# Patient Record
Sex: Female | Born: 1960 | Race: Black or African American | Hispanic: No | Marital: Married | Smoking: Never smoker
Health system: Southern US, Community
[De-identification: ages and names within clinical notes are randomized; demographics above are authoritative.]

## PROBLEM LIST (undated history)

## (undated) DIAGNOSIS — R011 Cardiac murmur, unspecified: Secondary | ICD-10-CM

## (undated) DIAGNOSIS — I10 Essential (primary) hypertension: Secondary | ICD-10-CM

## (undated) DIAGNOSIS — E785 Hyperlipidemia, unspecified: Secondary | ICD-10-CM

## (undated) HISTORY — PX: TRIGGER FINGER RELEASE: SHX641

## (undated) HISTORY — DX: Essential (primary) hypertension: I10

## (undated) HISTORY — DX: Hyperlipidemia, unspecified: E78.5

## (undated) HISTORY — DX: Cardiac murmur, unspecified: R01.1

## (undated) HISTORY — PX: TONSILLECTOMY: SUR1361

## (undated) HISTORY — PX: UPPER GI ENDOSCOPY: SHX6162

## (undated) HISTORY — PX: COLONOSCOPY: SHX174

---

## 1998-01-20 ENCOUNTER — Other Ambulatory Visit: Admission: RE | Admit: 1998-01-20 | Discharge: 1998-01-20 | Payer: Self-pay | Admitting: Family Medicine

## 1999-02-23 ENCOUNTER — Other Ambulatory Visit: Admission: RE | Admit: 1999-02-23 | Discharge: 1999-02-23 | Payer: Self-pay | Admitting: Family Medicine

## 2001-03-27 ENCOUNTER — Other Ambulatory Visit: Admission: RE | Admit: 2001-03-27 | Discharge: 2001-03-27 | Payer: Self-pay | Admitting: Family Medicine

## 2002-04-02 ENCOUNTER — Other Ambulatory Visit: Admission: RE | Admit: 2002-04-02 | Discharge: 2002-04-02 | Payer: Self-pay | Admitting: Family Medicine

## 2003-04-04 ENCOUNTER — Other Ambulatory Visit: Admission: RE | Admit: 2003-04-04 | Discharge: 2003-04-04 | Payer: Self-pay | Admitting: Family Medicine

## 2004-08-24 ENCOUNTER — Encounter: Admission: RE | Admit: 2004-08-24 | Discharge: 2004-08-24 | Payer: Self-pay | Admitting: Family Medicine

## 2004-10-14 ENCOUNTER — Other Ambulatory Visit: Admission: RE | Admit: 2004-10-14 | Discharge: 2004-10-14 | Payer: Self-pay | Admitting: Family Medicine

## 2005-08-27 ENCOUNTER — Encounter: Admission: RE | Admit: 2005-08-27 | Discharge: 2005-08-27 | Payer: Self-pay | Admitting: Family Medicine

## 2005-09-09 ENCOUNTER — Encounter: Admission: RE | Admit: 2005-09-09 | Discharge: 2005-09-09 | Payer: Self-pay | Admitting: Family Medicine

## 2005-09-09 IMAGING — CR DG ANKLE COMPLETE 3+V*R*
3 series · 3 of 3 positions shown · non-contrast
Comparison: None.

CLINICAL DATA: Right ankle pain.
 THREE VIEW RIGHT ANKLE:

[view not recorded (1 of 3)]
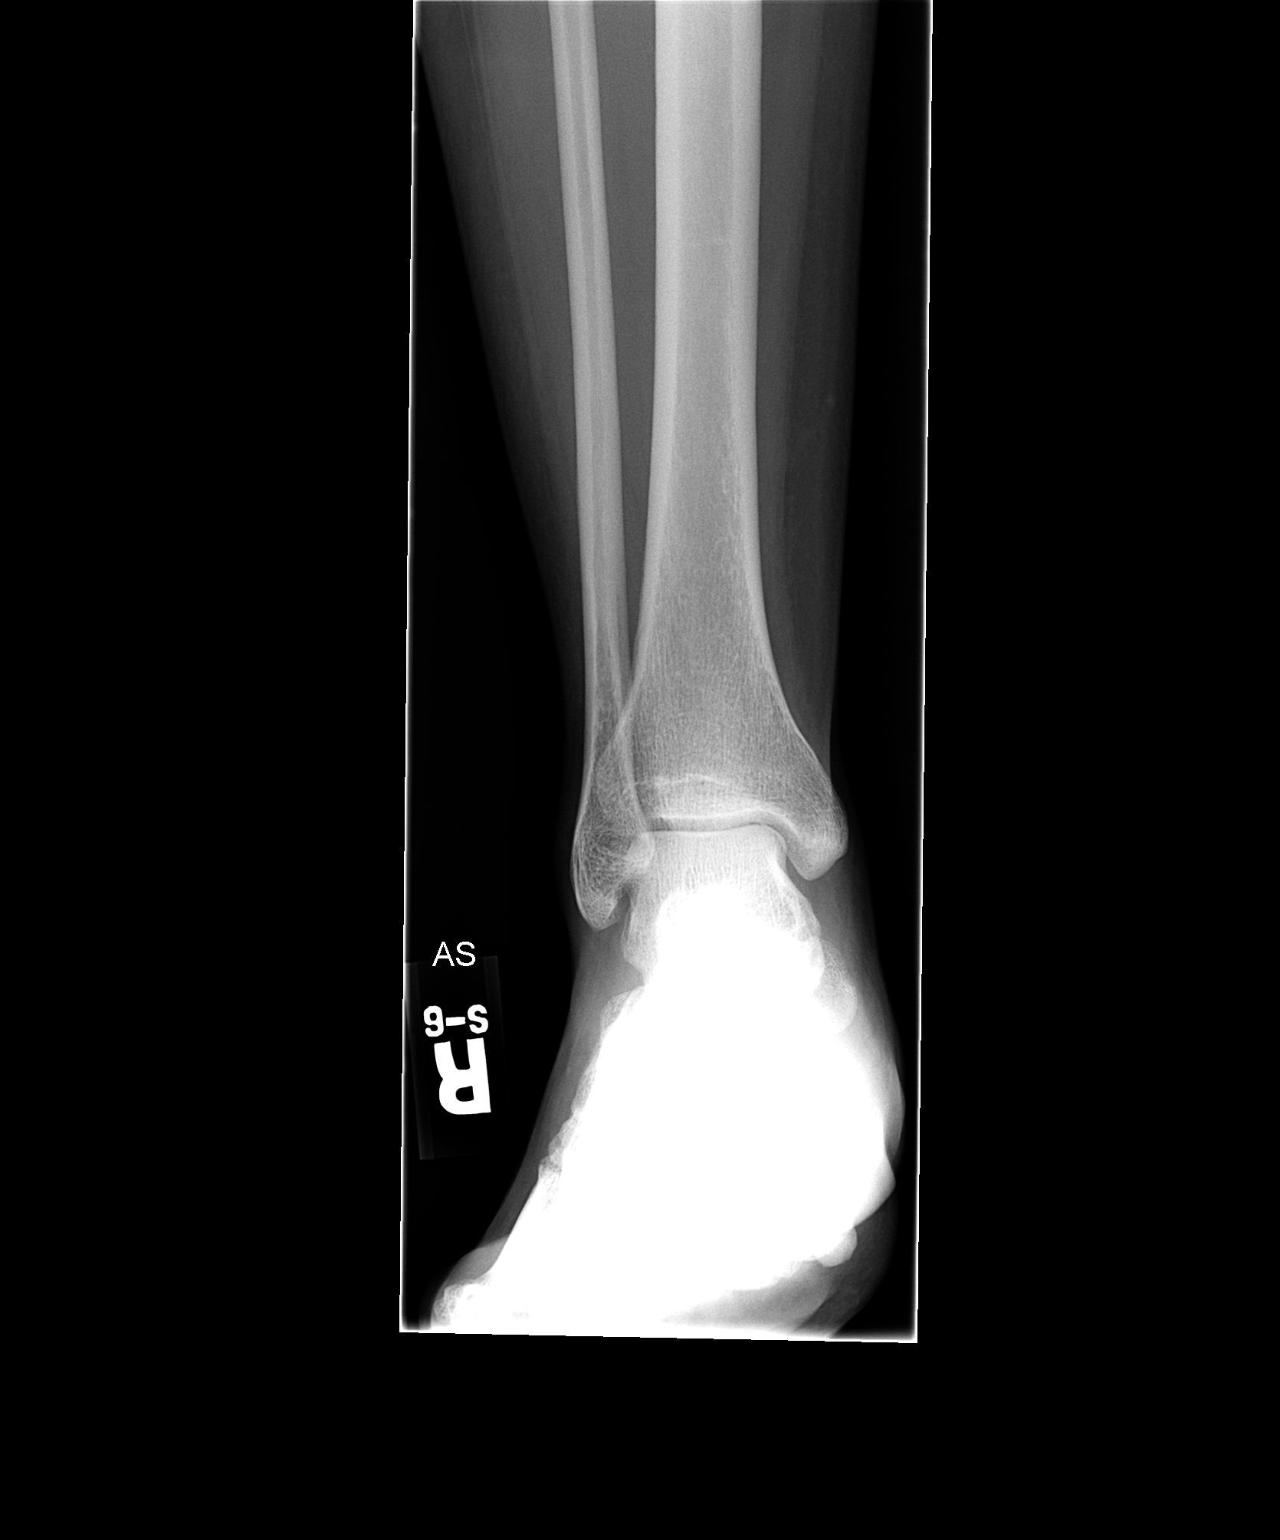

[view not recorded (2 of 3)]
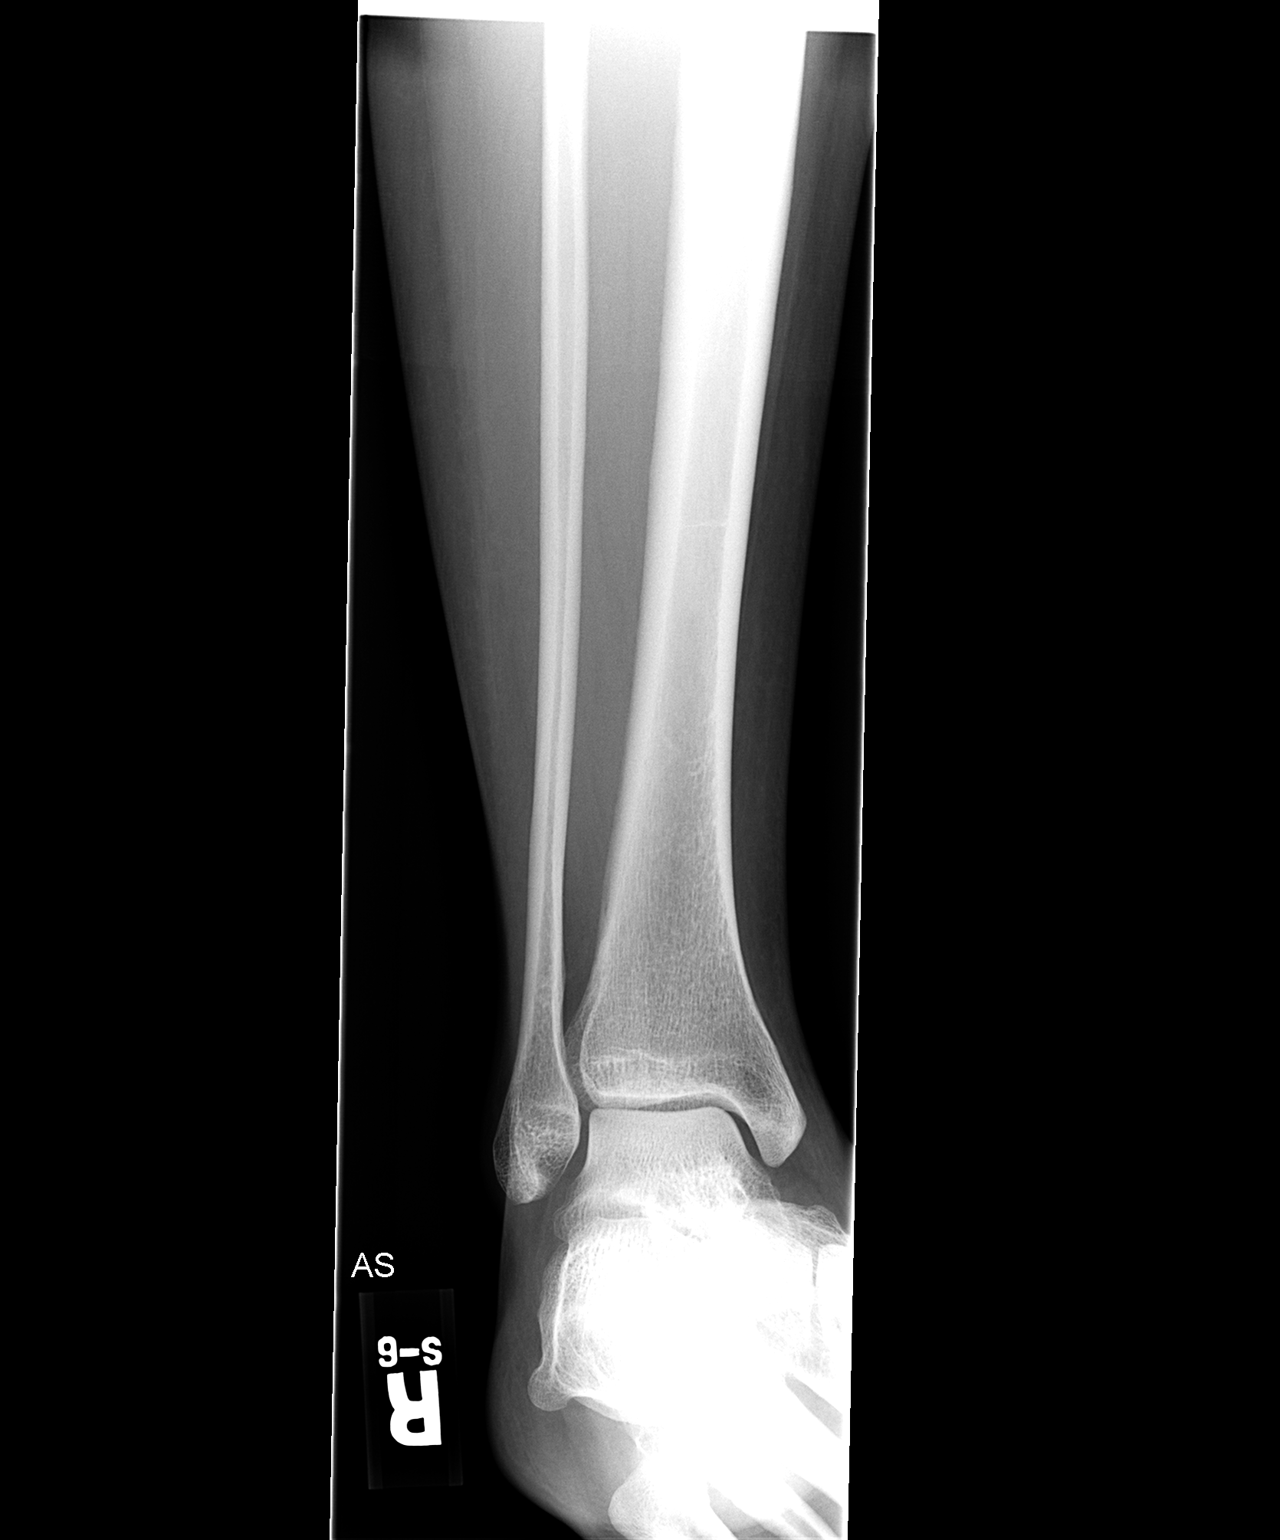

[view not recorded (3 of 3)]
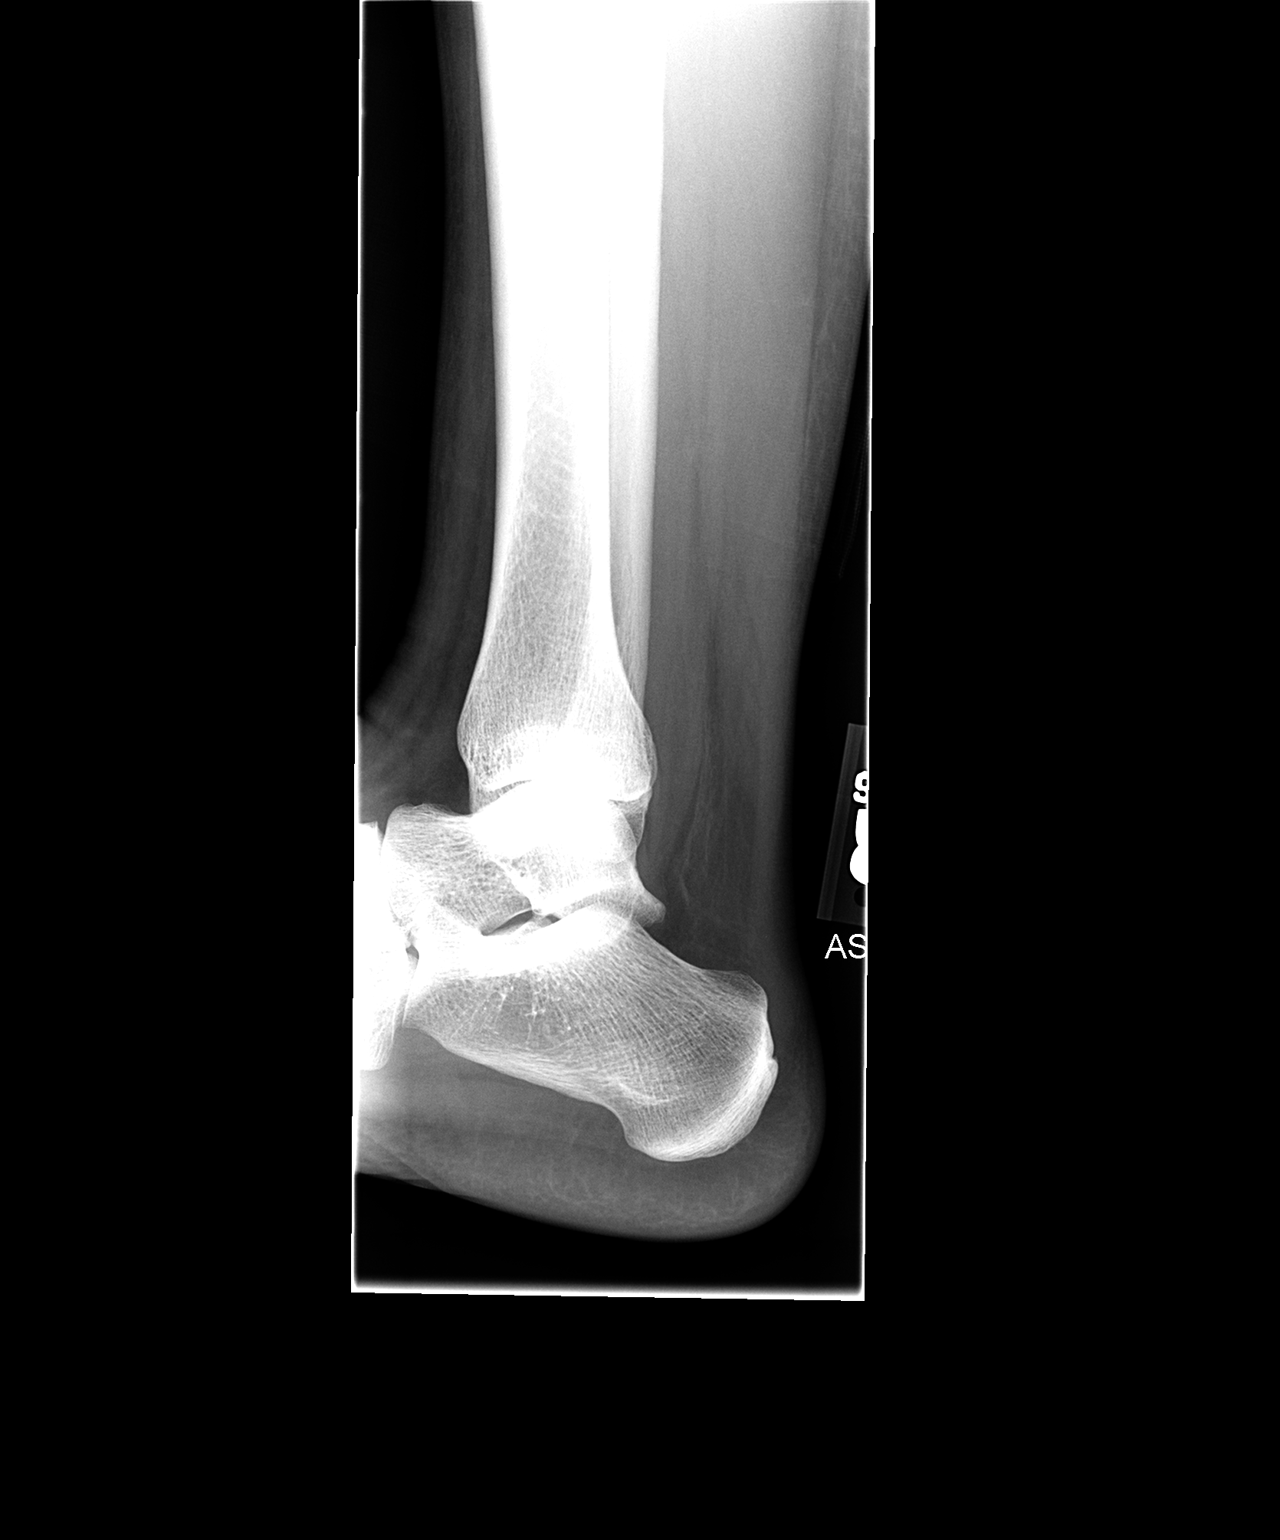

[3 of 3 positions shown; findings below may reference images not displayed]

There is no evidence of fracture, dislocation, or joint effusion. There is no evidence of arthropathy or other focal bone abnormality.  Soft tissues are unremarkable.
IMPRESSION: Negative.

## 2006-04-29 ENCOUNTER — Encounter: Admission: RE | Admit: 2006-04-29 | Discharge: 2006-04-29 | Payer: Self-pay | Admitting: Sports Medicine

## 2006-04-29 IMAGING — CT CT EXTREM UP W/O CM*R*
1 of 3 series · 9 of 14 positions shown, 12 images · IV contrast (agent unspecified)
Comparison: none

CLINICAL DATA: Proximal 2nd metacarpal fracture.  Evaluate healing. 
 CT OF THE RIGHT HAND WITHOUT CONTRAST:
TECHNIQUE: Multidetector CT imaging through the right hand was performed according to the standard protocol.  No intravenous contrast was administered.  Multiplanar CT image reconstructions were also generated.

[Series 4: wrist/standard · axial · 0.23mm/px · z∈[+21,+153]mm · 9 of 265 slices shown, 12 images]
[im 27/265  soft-tissue]
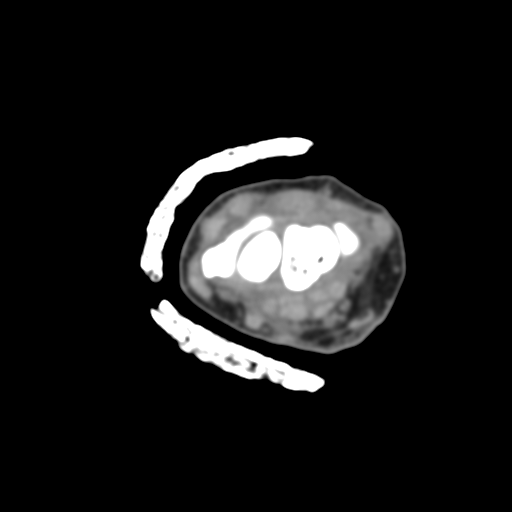
[im 27/265  bone]
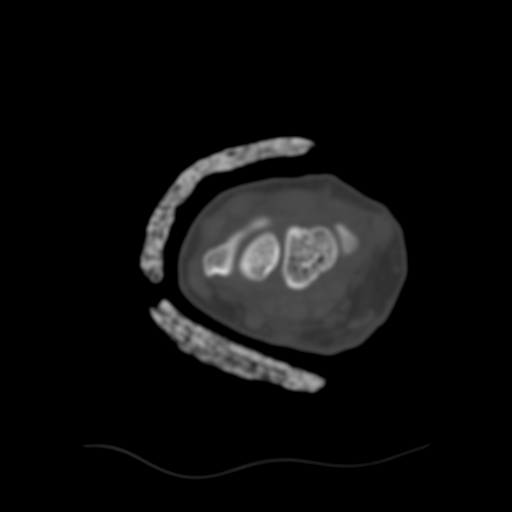
[im 53/265  bone]
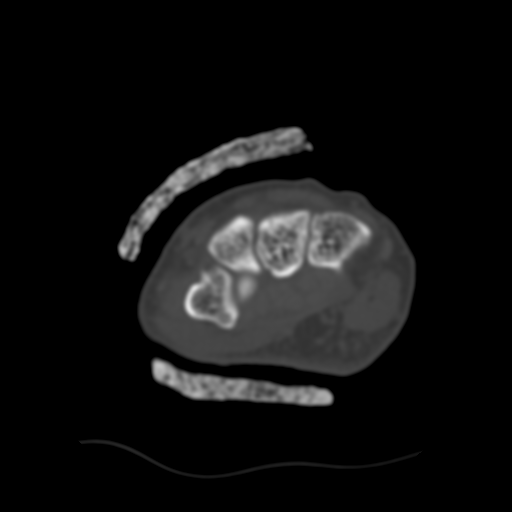
[im 80/265  bone]
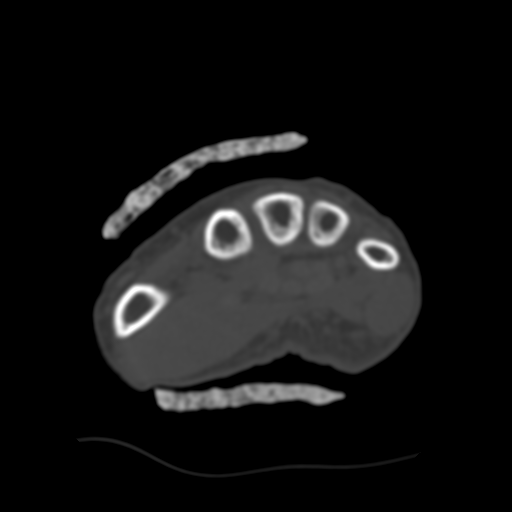
[im 106/265  bone]
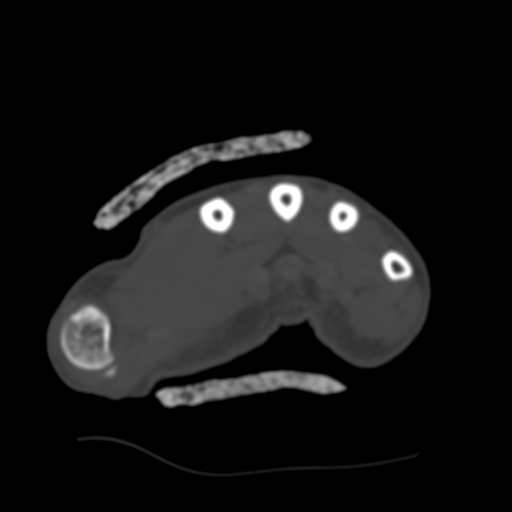
[im 133/265  soft-tissue]
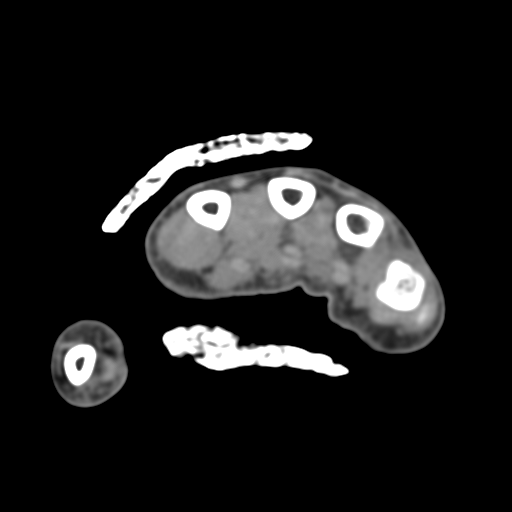
[im 133/265  bone]
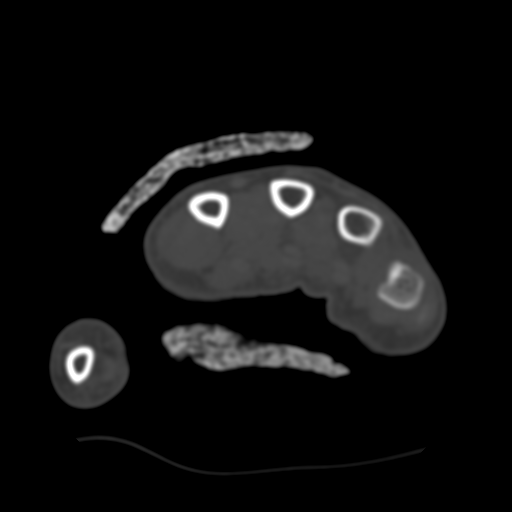
[im 159/265  bone]
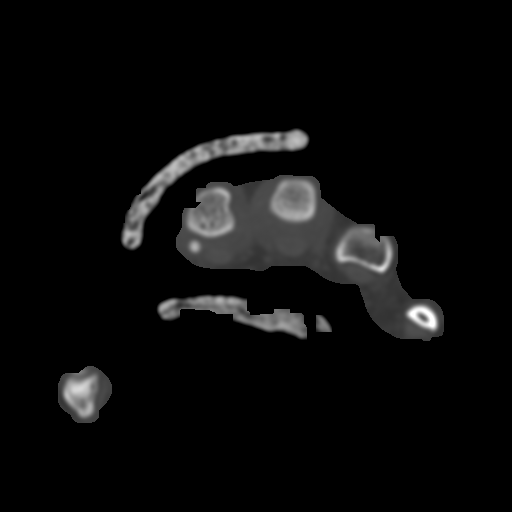
[im 185/265  bone]
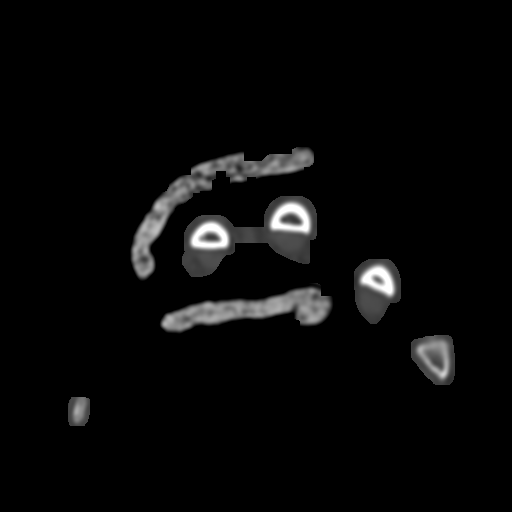
[im 212/265  bone]
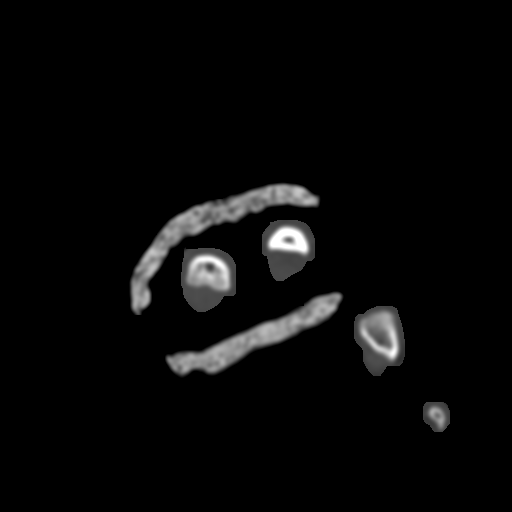
[im 238/265  soft-tissue]
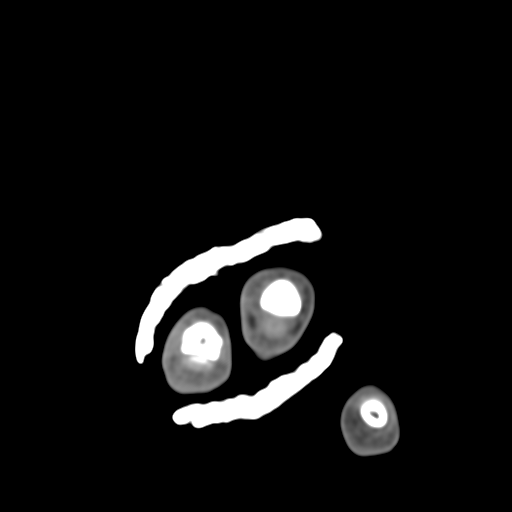
[im 238/265  bone]
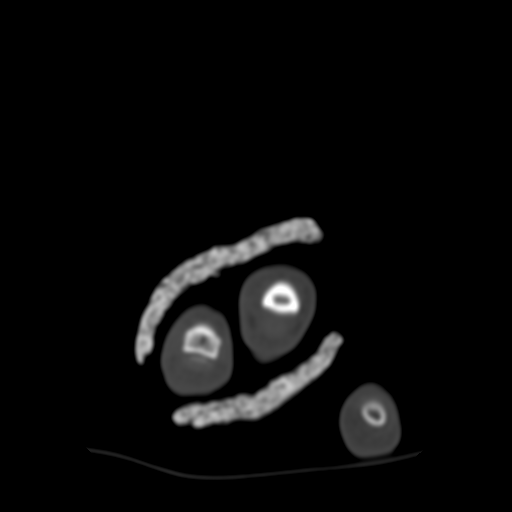

[9 of 14 positions shown; findings below may reference images not displayed]

FINDINGS: The 2nd and 3rd digits are in cast.  No fracture is seen.  There is no evidence of callus formation.  Alignment is normal.  The carpal bones are in normal position and the radiocarpal joint space appears normal.
IMPRESSION: No fracture is seen.  No callus is noted.  Normal alignment is maintained.

## 2006-08-30 ENCOUNTER — Encounter: Admission: RE | Admit: 2006-08-30 | Discharge: 2006-08-30 | Payer: Self-pay | Admitting: Family Medicine

## 2007-09-01 ENCOUNTER — Encounter: Admission: RE | Admit: 2007-09-01 | Discharge: 2007-09-01 | Payer: Self-pay | Admitting: Family Medicine

## 2008-09-05 ENCOUNTER — Encounter: Admission: RE | Admit: 2008-09-05 | Discharge: 2008-09-05 | Payer: Self-pay

## 2009-09-26 ENCOUNTER — Encounter: Admission: RE | Admit: 2009-09-26 | Discharge: 2009-09-26 | Payer: Self-pay | Admitting: Family Medicine

## 2010-08-21 ENCOUNTER — Other Ambulatory Visit: Payer: Self-pay | Admitting: Family Medicine

## 2010-08-21 DIAGNOSIS — Z1231 Encounter for screening mammogram for malignant neoplasm of breast: Secondary | ICD-10-CM

## 2010-09-29 ENCOUNTER — Ambulatory Visit
Admission: RE | Admit: 2010-09-29 | Discharge: 2010-09-29 | Disposition: A | Discharge status: Home / Self Care | Payer: BC Managed Care – PPO | Source: Ambulatory Visit | Attending: Family Medicine | Admitting: Family Medicine

## 2010-09-29 DIAGNOSIS — Z1231 Encounter for screening mammogram for malignant neoplasm of breast: Secondary | ICD-10-CM

## 2011-01-15 ENCOUNTER — Encounter: Payer: Self-pay | Admitting: Cardiovascular Disease

## 2011-01-15 ENCOUNTER — Ambulatory Visit (INDEPENDENT_AMBULATORY_CARE_PROVIDER_SITE_OTHER): Payer: BC Managed Care – PPO | Admitting: Cardiovascular Disease

## 2011-01-15 DIAGNOSIS — M79602 Pain in left arm: Secondary | ICD-10-CM | POA: Insufficient documentation

## 2011-01-15 DIAGNOSIS — I10 Essential (primary) hypertension: Secondary | ICD-10-CM

## 2011-01-15 DIAGNOSIS — R002 Palpitations: Secondary | ICD-10-CM | POA: Insufficient documentation

## 2011-01-15 DIAGNOSIS — E782 Mixed hyperlipidemia: Secondary | ICD-10-CM | POA: Insufficient documentation

## 2011-01-15 DIAGNOSIS — E785 Hyperlipidemia, unspecified: Secondary | ICD-10-CM

## 2011-01-15 DIAGNOSIS — M79609 Pain in unspecified limb: Secondary | ICD-10-CM

## 2011-01-15 NOTE — Assessment & Plan Note (Signed)
No significant palpitations on her beta blocker.

## 2011-01-15 NOTE — Assessment & Plan Note (Signed)
Blood pressure is doing well on her current medication regimen. No changes were made.

## 2011-01-15 NOTE — Patient Instructions (Signed)
You are doing well. No medication changes were made. Please call us if you have new issues that need to be addressed before your next appt.  We will call you for a follow up Appt. In 12 months  

## 2011-01-15 NOTE — Assessment & Plan Note (Signed)
Etiology of her left arm pain is likely noncardiac. I suggested she continue to be very active and to contact our office if it seems to occur predominantly with exertion, though she should try alternative exercise modalities. It could be secondary to carpal tunnel syndrome or tendinitis. She could try NSAIDs if symptoms persist.

## 2011-01-15 NOTE — Assessment & Plan Note (Signed)
We have suggested she stay on Vytorin as tolerated. An alternate option would be to take a half dose every day.

## 2011-01-15 NOTE — Progress Notes (Signed)
Patient ID: Monica Colon, female    DOB: 1960/09/02, 50 y.o.   MRN: [id number]  HPI Comments: Part pleasant 50 year old woman who presents with her mother with a history of palpitations and fluttering in her chest back in 2009 also with a history of hypertension, hyperlipidemia who presents to establish care in the office with recent symptoms of left arm pain.  She reports that in general she is very active. Sometimes when she is on a treadmill, she has pain extending down from her elbow into her hand. It seems to be exertional related. She does not notice this on other exercise machines. She has not noticed this when she travels and airports and she is in a hurry moving from terminal to terminal. It has been relatively brief in nature, and she describes it as a restrictive feeling of her left wrist and hand.   Otherwise she has been feeling well and has no complaints. She is tolerating Vytorin 10/10 mg every other day  EKG shows normal sinus rhythm with rate 68 beats per minute with no significant ST or T wave changes    Outpatient Encounter Prescriptions as of 01/15/2011  Medication Sig Dispense Refill  . ascorbic acid (C 1000) 1000 MG tablet Take 1,000 mg by mouth daily.        Marland Kitchen aspirin 325 MG tablet Take 325 mg by mouth daily.        Marland Kitchen Coral Calcium-Magnesium-Vit D 200-100-100 MG-MG-UNIT CAPS Take 2 tablets by mouth daily.        . fish oil-omega-3 fatty acids 1000 MG capsule Take 2 g by mouth daily.        . hydrochlorothiazide (HYDRODIURIL) 25 MG tablet Take 1 tablet by mouth Daily.      Marland Kitchen KLOR-CON 10 10 MEQ CR tablet Take 2 tablets by mouth Daily.      . Multiple Vitamin (MULTIVITAMIN) capsule Take 1 capsule by mouth daily.        . Probiotic Product (ALIGN) 4 MG CAPS Take 1 tablet by mouth daily.        Marland Kitchen VYTORIN 10-10 MG per tablet 3 or 4 tabs po weekly         Review of Systems  Constitutional: Negative.   HENT: Negative.   Eyes: Negative.   Respiratory: Negative.     Cardiovascular: Negative.   Gastrointestinal: Negative.   Musculoskeletal: Negative.        Left arm and forearm pain  Skin: Negative.   Neurological: Negative.   Hematological: Negative.   Psychiatric/Behavioral: Negative.   All other systems reviewed and are negative.    BP 141/92  Pulse 74  Wt 147 lb (66.679 kg)  Physical Exam  Nursing note and vitals reviewed. Constitutional: She is oriented to person, place, and time. She appears well-developed and well-nourished.  HENT:  Head: Normocephalic.  Nose: Nose normal.  Mouth/Throat: Oropharynx is clear and moist.  Eyes: Conjunctivae are normal. Pupils are equal, round, and reactive to light.  Neck: Normal range of motion. Neck supple. No JVD present.  Cardiovascular: Normal rate, regular rhythm, S1 normal, S2 normal, normal heart sounds and intact distal pulses.  Exam reveals no gallop and no friction rub.   No murmur heard. Pulmonary/Chest: Effort normal and breath sounds normal. No respiratory distress. She has no wheezes. She has no rales. She exhibits no tenderness.  Abdominal: Soft. Bowel sounds are normal. She exhibits no distension. There is no tenderness.  Musculoskeletal: Normal range of motion. She exhibits  no edema and no tenderness.  Lymphadenopathy:    She has no cervical adenopathy.  Neurological: She is alert and oriented to person, place, and time. Coordination normal.  Skin: Skin is warm and dry. No rash noted. No erythema.  Psychiatric: She has a normal mood and affect. Her behavior is normal. Judgment and thought content normal.         Assessment and Plan

## 2011-10-01 ENCOUNTER — Other Ambulatory Visit: Payer: Self-pay | Admitting: Family Medicine

## 2011-10-01 DIAGNOSIS — Z1231 Encounter for screening mammogram for malignant neoplasm of breast: Secondary | ICD-10-CM

## 2011-10-27 ENCOUNTER — Ambulatory Visit (INDEPENDENT_AMBULATORY_CARE_PROVIDER_SITE_OTHER): Payer: BC Managed Care – PPO | Admitting: Cardiovascular Disease

## 2011-10-27 ENCOUNTER — Encounter: Payer: Self-pay | Admitting: Cardiovascular Disease

## 2011-10-27 VITALS — BP 108/64 | HR 58 | Ht 65.0 in | Wt 144.5 lb

## 2011-10-27 DIAGNOSIS — M79609 Pain in unspecified limb: Secondary | ICD-10-CM

## 2011-10-27 DIAGNOSIS — R002 Palpitations: Secondary | ICD-10-CM

## 2011-10-27 DIAGNOSIS — E785 Hyperlipidemia, unspecified: Secondary | ICD-10-CM

## 2011-10-27 DIAGNOSIS — M79602 Pain in left arm: Secondary | ICD-10-CM

## 2011-10-27 DIAGNOSIS — I10 Essential (primary) hypertension: Secondary | ICD-10-CM

## 2011-10-27 MED ORDER — PROPRANOLOL HCL 10 MG PO TABS: 10.0000 mg | ORAL_TABLET | Freq: Three times a day (TID) | ORAL | Status: DC | PRN

## 2011-10-27 NOTE — Assessment & Plan Note (Signed)
No further arm pain or chest pain. She has been active without symptoms.

## 2011-10-27 NOTE — Progress Notes (Signed)
Patient ID: Monica Colon, female    DOB: 06-Apr-1960, 51 y.o.   MRN: [id number]  HPI Comments:  pleasant 51 year old woman who presents with her mother with a history of palpitations and fluttering in her chest back in 2009 also with a history of hypertension, hyperlipidemia, previously seen for left arm pain.  She reports that she has had problems with flying on airplanes. She wonders if it could be a digestive problem or a vasovagal. She recalls several episodes where she developed palpitations while flying to [place] 10/13/2011. She was lightheaded midway through the flank, felt hot. She also had symptoms going home on July 18 when she flew to [place]. she was hot, had nausea and vomiting x1 and then felt better. She has occasional cough and felt anxious. Since the trip, she has had occasional palpitations  Prior to this she had been feeling well and has no complaints. She is still exercising without complaints  EKG shows normal sinus rhythm with rate 58 beats per minute with no significant ST or T wave changes    Outpatient Encounter Prescriptions as of 10/27/2011  Medication Sig Dispense Refill  . ascorbic acid (C 1000) 1000 MG tablet Take 1,000 mg by mouth daily.       Marland Kitchen aspirin 325 MG tablet Take 325 mg by mouth daily.        Marland Kitchen Coral Calcium-Magnesium-Vit D 200-100-100 MG-MG-UNIT CAPS Take 2 tablets by mouth daily.        . fish oil-omega-3 fatty acids 1000 MG capsule Take 2 g by mouth daily.        . hydrochlorothiazide (HYDRODIURIL) 25 MG tablet Take 1 tablet by mouth Daily.      Marland Kitchen KLOR-CON 10 10 MEQ CR tablet Take 2 tablets by mouth Daily.      . Multiple Vitamin (MULTIVITAMIN) capsule Take 1 capsule by mouth daily.        . Probiotic Product (ALIGN) 4 MG CAPS Take 1 tablet by mouth daily.        Marland Kitchen DISCONTD: VYTORIN 10-10 MG per tablet 3 or 4 tabs po weekly         Review of Systems  Constitutional: Negative.   HENT: Negative.   Eyes: Negative.   Respiratory: Positive for  cough.   Cardiovascular: Positive for palpitations.  Gastrointestinal: Negative.   Musculoskeletal: Negative.        Left arm and forearm pain  Skin: Negative.   Neurological: Negative.        Felt hot  Hematological: Negative.   Psychiatric/Behavioral: Negative.   All other systems reviewed and are negative.    BP 108/64  Pulse 58  Ht 5\' 5"  (1.651 m)  Wt 144 lb 8 oz (65.545 kg)  BMI 24.05 kg/m2  Physical Exam  Nursing note and vitals reviewed. Constitutional: She is oriented to person, place, and time. She appears well-developed and well-nourished.  HENT:  Head: Normocephalic.  Nose: Nose normal.  Mouth/Throat: Oropharynx is clear and moist.  Eyes: Conjunctivae are normal. Pupils are equal, round, and reactive to light.  Neck: Normal range of motion. Neck supple. No JVD present.  Cardiovascular: Normal rate, regular rhythm, S1 normal, S2 normal, normal heart sounds and intact distal pulses.  Exam reveals no gallop and no friction rub.   No murmur heard. Pulmonary/Chest: Effort normal and breath sounds normal. No respiratory distress. She has no wheezes. She has no rales. She exhibits no tenderness.  Abdominal: Soft. Bowel sounds are normal. She exhibits no  distension. There is no tenderness.  Musculoskeletal: Normal range of motion. She exhibits no edema and no tenderness.  Lymphadenopathy:    She has no cervical adenopathy.  Neurological: She is alert and oriented to person, place, and time. Coordination normal.  Skin: Skin is warm and dry. No rash noted. No erythema.  Psychiatric: She has a normal mood and affect. Her behavior is normal. Judgment and thought content normal.         Assessment and Plan

## 2011-10-27 NOTE — Assessment & Plan Note (Signed)
It appears that she has held her vytorin. We will discuss this with her on her next visit.

## 2011-10-27 NOTE — Assessment & Plan Note (Addendum)
Symptoms concerning for anxiety or panic attack. Unable to exclude vasovagal episodes and she did have dizziness and felt better lying down. She is concerned about her palpitations and we have suggested she wear a 48 hour monitor. I suggested she try low-dose propranolol as needed for plane travel, episodes of palpitations. This could be used for vasovagal episodes.

## 2011-10-27 NOTE — Patient Instructions (Signed)
You are doing well. Please take propranolol as needed for palpitations and vaso-vagal epsiodes  We will order a holter monitor for palpitations  Please call us if you have new issues that need to be addressed before your next appt.  Your physician wants you to follow-up in: 6 months.  You will receive a reminder letter in the mail two months in advance. If you don't receive a letter, please call our office to schedule the follow-up appointment.

## 2011-10-27 NOTE — Assessment & Plan Note (Signed)
I suggested she stay on her HCTZ, holding the diuretic prior to any air travel to avoid dehydration.

## 2011-10-29 ENCOUNTER — Ambulatory Visit: Payer: BC Managed Care – PPO

## 2011-11-01 ENCOUNTER — Ambulatory Visit: Payer: BC Managed Care – PPO | Admitting: Cardiovascular Disease

## 2011-11-01 ENCOUNTER — Ambulatory Visit
Admission: RE | Admit: 2011-11-01 | Discharge: 2011-11-01 | Disposition: A | Discharge status: Home / Self Care | Payer: BC Managed Care – PPO | Source: Ambulatory Visit | Attending: Family Medicine | Admitting: Family Medicine

## 2011-11-01 DIAGNOSIS — Z1231 Encounter for screening mammogram for malignant neoplasm of breast: Secondary | ICD-10-CM

## 2011-11-02 ENCOUNTER — Ambulatory Visit: Payer: BC Managed Care – PPO

## 2011-11-16 ENCOUNTER — Other Ambulatory Visit: Payer: Self-pay | Admitting: Cardiovascular Disease

## 2011-11-16 DIAGNOSIS — R002 Palpitations: Secondary | ICD-10-CM

## 2012-01-07 ENCOUNTER — Telehealth: Payer: Self-pay

## 2012-01-07 ENCOUNTER — Ambulatory Visit (INDEPENDENT_AMBULATORY_CARE_PROVIDER_SITE_OTHER): Payer: BC Managed Care – PPO

## 2012-01-07 VITALS — BP 124/78 | HR 81 | Ht 65.0 in | Wt 149.0 lb

## 2012-01-07 DIAGNOSIS — R002 Palpitations: Secondary | ICD-10-CM

## 2012-01-07 NOTE — Telephone Encounter (Signed)
Pt calling c/o palpitations and "irregular heartbeat". She says this is different than how she has felt in past. She says she recently had flu shot and [place] from mountains, wonders if these are r/t symptoms.  I reassured her this are probably unrelated. She wants to be seen. I explained Dr. Mariah Milling not in office this pm but can bring her in for EKG today at 2 pm.  Understanding verb. Will come in today at 2 pm for EKG.

## 2012-01-07 NOTE — Progress Notes (Signed)
Pt c/o palpitations worse than usual. Says it feels like it is "beating irregular". Says it sometimes makes her feel she has to "cough".  Denies any changes in meds. No other symptoms reported.  Will review with Dr. Kirke Corin  EKG reviewed by Dr. Kirke Corin. Per Dr. Kirke Corin, no changes seen on EKG. No abnormalities noted. He advised pt continue current regimen and keep f/u with Dr. Mariah Milling  Pt informed.  She says it is hard to explain, but she is having symptoms that are "different than before", says she feels as if she has to cough d/t palpitations.  I told her I would share with Dr. Mariah Milling to see if he wants to order a 30 day monitor, etc.  Otherwise she will continue on present regimen. Understanding verb.

## 2012-01-07 NOTE — Patient Instructions (Addendum)
Continue on present regimen and keep appt as scheduled with Dr. Gollan.  

## 2012-01-12 ENCOUNTER — Telehealth: Payer: Self-pay

## 2012-01-12 NOTE — Telephone Encounter (Signed)
Pt says she is feeling better and wishes to wait on starting Bystolic. She will call us should symptoms return.

## 2012-01-12 NOTE — Telephone Encounter (Signed)
Message copied by Marcelle Overlie on Wed Jan 12, 2012  8:15 AM ------      Message from: Antonieta Iba      Created: Tue Jan 11, 2012  6:33 PM      Regarding: RE: question       She may want to try bystolic  5 mg daily before any further testing.      She is probably still having APCs and PVCs.       it was seen on a Holter.      Don't know if it would add very much to watch rhythm for a longer period of time.                   ----- Message -----         From: Marcelle Overlie, RN         Sent: 01/07/2012   2:33 PM           To: Antonieta Iba, MD      Subject: question                                                 Pt wants to know if she can wear 30 day monitor      See 01/07/12 note      thanks

## 2012-07-02 DIAGNOSIS — K219 Gastro-esophageal reflux disease without esophagitis: Secondary | ICD-10-CM | POA: Insufficient documentation

## 2012-10-20 ENCOUNTER — Other Ambulatory Visit: Payer: Self-pay

## 2012-10-20 DIAGNOSIS — Z1231 Encounter for screening mammogram for malignant neoplasm of breast: Secondary | ICD-10-CM

## 2012-11-08 ENCOUNTER — Ambulatory Visit
Admission: RE | Admit: 2012-11-08 | Discharge: 2012-11-08 | Disposition: A | Discharge status: Home / Self Care | Payer: BC Managed Care – PPO | Source: Ambulatory Visit

## 2012-11-08 DIAGNOSIS — Z1231 Encounter for screening mammogram for malignant neoplasm of breast: Secondary | ICD-10-CM

## 2012-11-25 ENCOUNTER — Ambulatory Visit: Payer: Self-pay

## 2012-11-25 ENCOUNTER — Emergency Department: Payer: Self-pay | Admitting: Emergency Medicine

## 2012-11-25 LAB — BASIC METABOLIC PANEL
BUN: 12 mg/dL (ref 7–18)
Calcium, Total: 9.2 mg/dL (ref 8.5–10.1)
Chloride: 101 mmol/L (ref 98–107)
Creatinine: 0.87 mg/dL (ref 0.60–1.30)
EGFR (African American): >60
Osmolality: 275 (ref 275–301)
Potassium: 3.3 mmol/L — ABNORMAL LOW (ref 3.5–5.1)

## 2012-11-25 LAB — CBC
HCT: 39.8 % (ref 35.0–47.0)
HGB: 14.1 g/dL (ref 12.0–16.0)
MCH: 33 pg (ref 26.0–34.0)
Platelet: 303 10*3/uL (ref 150–440)
RBC: 4.26 10*6/uL (ref 3.80–5.20)
RDW: 13 % (ref 11.5–14.5)
WBC: 7.3 10*3/uL (ref 3.6–11.0)

## 2012-11-25 LAB — CK TOTAL AND CKMB (NOT AT ARMC): CK, Total: 59 U/L (ref 21–215)

## 2012-11-25 IMAGING — CR DG CHEST 2V
1 series · 2 of 2 positions shown · non-contrast
Comparison: none

REASON FOR EXAM: Chest Pain
COMMENTS:

PROCEDURE:     DXR - DXR CHEST PA (OR AP) AND LATERAL  - [DATE]  [DATE]
RESULT:     No prior studies. Mediastinum and hilar structures are normal.
Lungs clear. Heart size normal. No bony abnormality.

[Series 1: pa · 0.17mm/px · 2 of 2 slices shown]
[im 1/2]
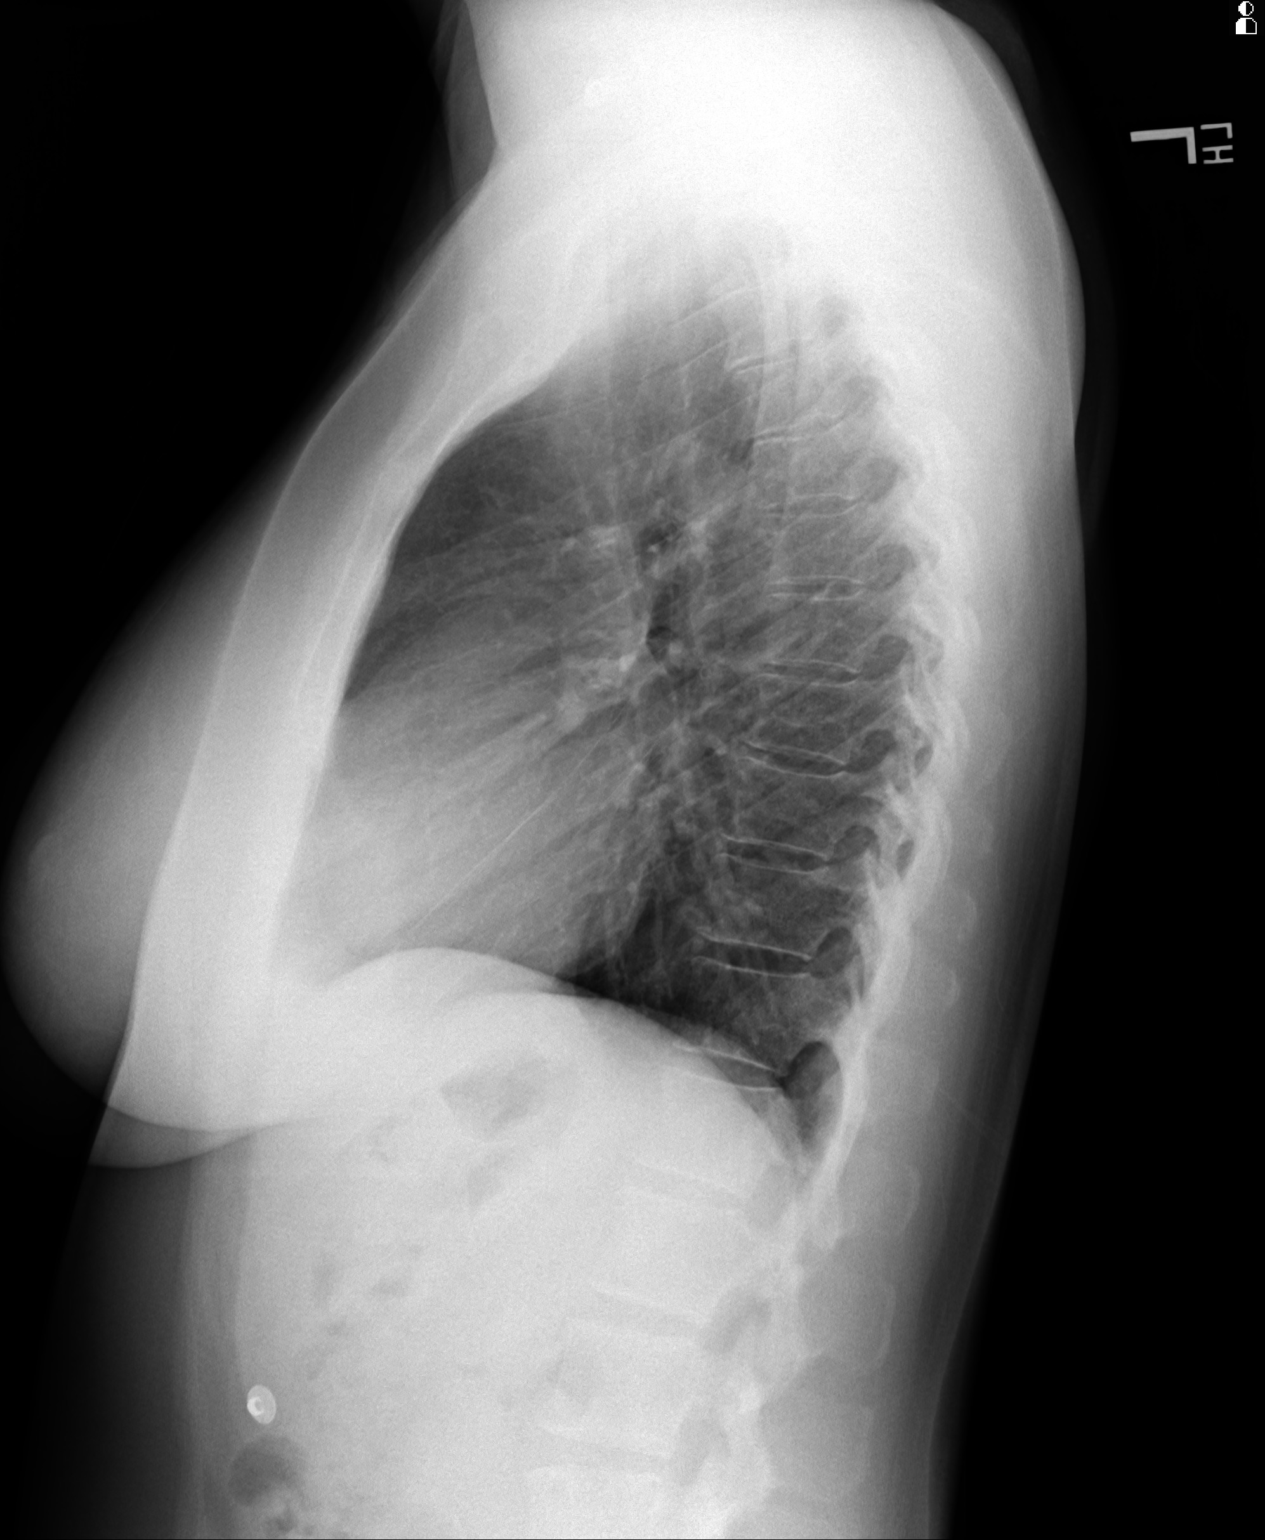
[im 2/2]
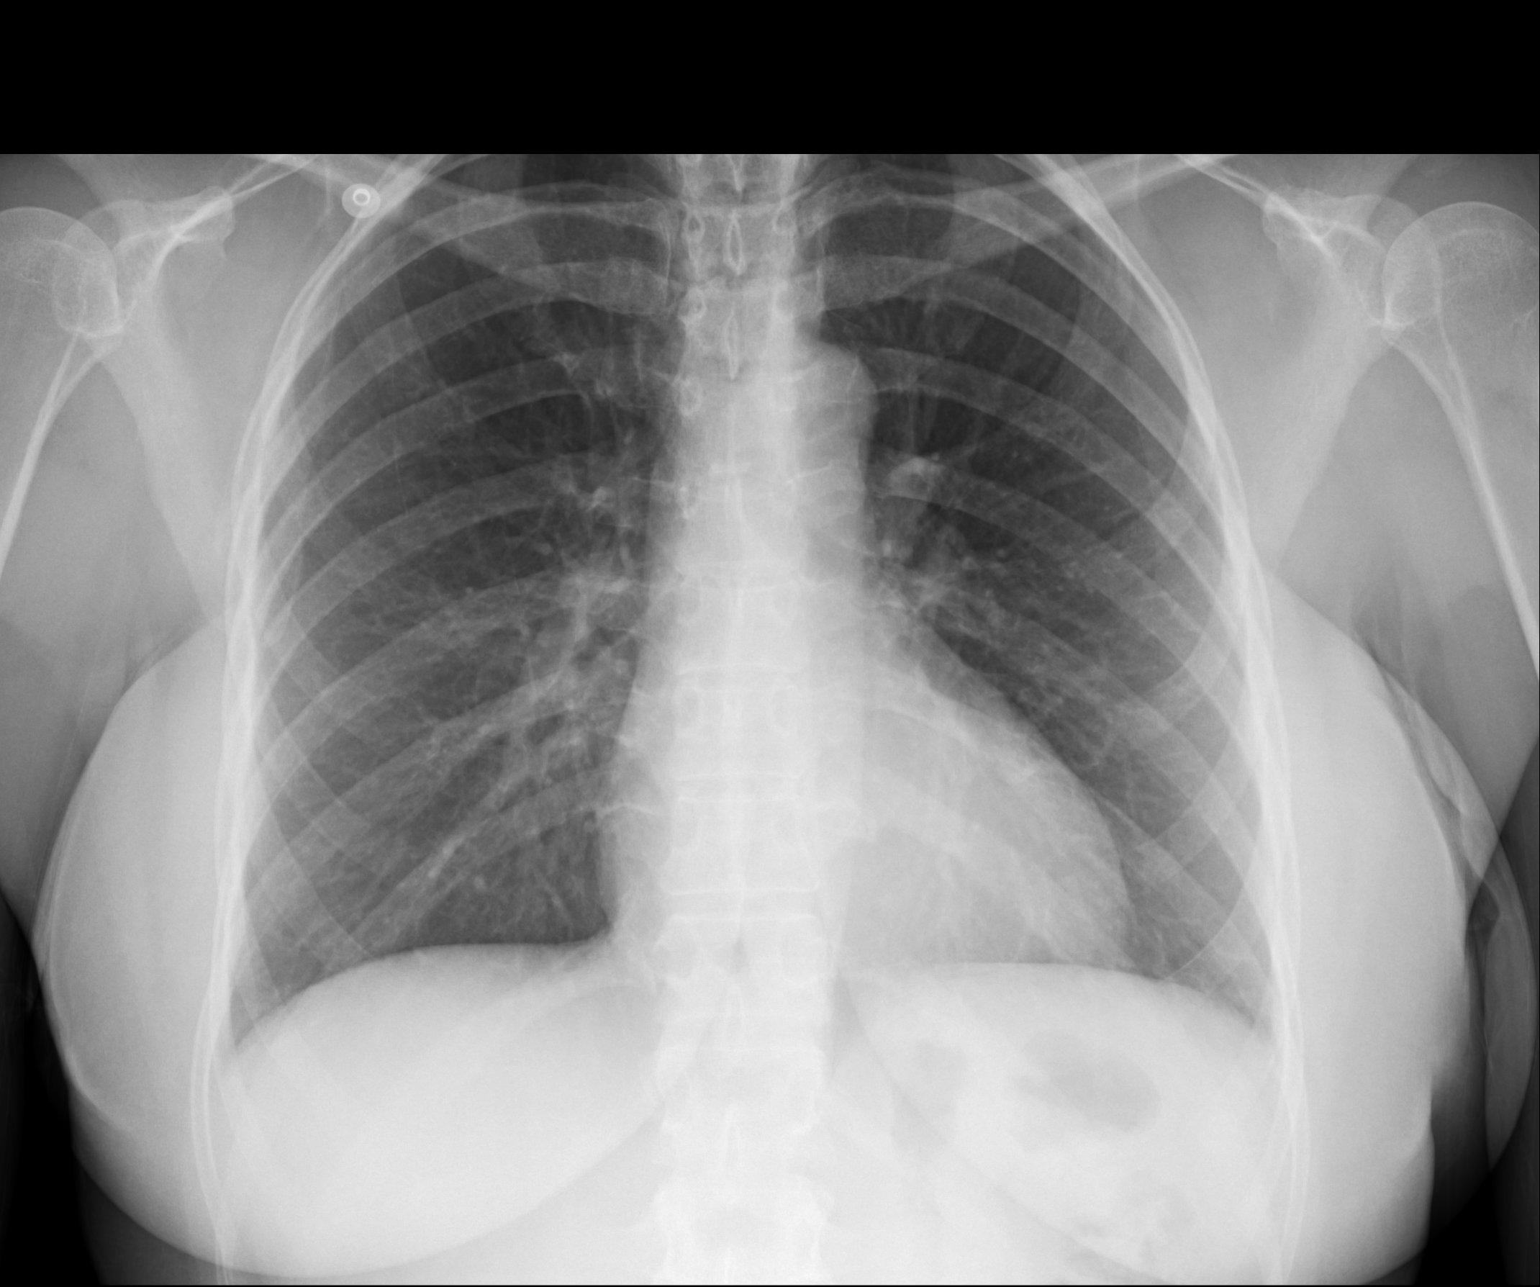

[2 of 2 positions shown; findings below may reference images not displayed]

IMPRESSION: No acute abnormality.

## 2012-11-30 ENCOUNTER — Ambulatory Visit: Payer: BC Managed Care – PPO | Admitting: Physician Assistant

## 2012-12-13 ENCOUNTER — Encounter: Payer: Self-pay | Admitting: Cardiovascular Disease

## 2012-12-13 ENCOUNTER — Ambulatory Visit (INDEPENDENT_AMBULATORY_CARE_PROVIDER_SITE_OTHER): Payer: BC Managed Care – PPO | Admitting: Cardiovascular Disease

## 2012-12-13 VITALS — BP 120/84 | HR 89 | Ht 65.0 in | Wt 150.0 lb

## 2012-12-13 DIAGNOSIS — I10 Essential (primary) hypertension: Secondary | ICD-10-CM

## 2012-12-13 DIAGNOSIS — R079 Chest pain, unspecified: Secondary | ICD-10-CM

## 2012-12-13 DIAGNOSIS — R0602 Shortness of breath: Secondary | ICD-10-CM

## 2012-12-13 MED ORDER — COLCHICINE 0.6 MG PO TABS: 0.6000 mg | ORAL_TABLET | Freq: Two times a day (BID) | ORAL | Status: DC | PRN

## 2012-12-13 NOTE — Progress Notes (Signed)
Patient ID: Monica Colon, female    DOB: Aug 19, 1960, 52 y.o.   MRN: [id number]  HPI Comments: 52 year old woman with a history of palpitations and fluttering in her chest back in 2009 also with a history of hypertension, hyperlipidemia, previously seen for left arm pain. He presents today and reports having recent chest pain symptoms radiating up in her clavicle and neck area. August 22 she went to [facility]. Workup in the emergency room with negative cardiac enzymes, negative chest x-ray. She was given GI cocktail.  She saw her primary care physician and she continued to have pain. Pain described as pleuritic, with some shortness of breath.  He continued to have symptoms and presented to the emergency room at [facility]. Potassium was 3.1. She had a second visit to [facility]. Over the course of her 2 visits, she had significant testing including stress echo that showed no wall motion abnormality, chest x-rays, chest CT scan showing left greater than right pleural effusion with small pericardial effusion, atelectasis the left, neck CTA which was essentially normal. Notes indicate she was instructed to take anti-inflammatories  Initial EKG at [facility] August 20 13,014 showed no significant ST-T wave changes Potassium at [facility] was 3.3  Since her discharge from [facility], she has continued to have low-grade discomfort, improved with NSAIDs such as Advil.  Other blood work of note that at [facility] included elevated ESR and CRP. CRP was 16, sedimentation rate 100. Normal cardiac enzymes  EKG shows normal sinus rhythm with diffuse T-wave inversion in leads V2 through V6, ST and T wave abnormality in 2, 3, aVF    Outpatient Encounter Prescriptions as of 12/13/2012  Medication Sig Dispense Refill  . ascorbic acid (C 1000) 1000 MG tablet Take 1,000 mg by mouth daily.       Marland Kitchen aspirin 325 MG tablet Take 325 mg by mouth daily.        Marland Kitchen Coral Calcium-Magnesium-Vit D 200-100-100 MG-MG-UNIT CAPS Take 2  tablets by mouth daily.        . fish oil-omega-3 fatty acids 1000 MG capsule Take 2 g by mouth daily.        . hydrochlorothiazide (HYDRODIURIL) 25 MG tablet Take 1 tablet by mouth Daily.      Marland Kitchen KLOR-CON 10 10 MEQ CR tablet Take 2 tablets by mouth Daily.      . Multiple Vitamin (MULTIVITAMIN) capsule Take 1 capsule by mouth daily.        . Probiotic Product (ALIGN) 4 MG CAPS Take 1 tablet by mouth daily.        . propranolol (INDERAL) 10 MG tablet Take 1 tablet (10 mg total) by mouth 3 (three) times daily as needed.  90 tablet  6    Review of Systems  Constitutional: Negative.   HENT: Negative.   Eyes: Negative.   Cardiovascular: Positive for chest pain.  Gastrointestinal: Negative.   Musculoskeletal: Negative.        Left arm and forearm pain  Skin: Negative.   Neurological: Negative.        Felt hot  Psychiatric/Behavioral: Negative.   All other systems reviewed and are negative.    BP 120/84  Pulse 89  Ht 5\' 5"  (1.651 m)  Wt 150 lb (68.04 kg)  BMI 24.96 kg/m2  Physical Exam  Nursing note and vitals reviewed. Constitutional: She is oriented to person, place, and time. She appears well-developed and well-nourished.  HENT:  Head: Normocephalic.  Nose: Nose normal.  Mouth/Throat: Oropharynx  is clear and moist.  Eyes: Conjunctivae are normal. Pupils are equal, round, and reactive to light.  Neck: Normal range of motion. Neck supple. No JVD present.  Cardiovascular: Normal rate, regular rhythm, S1 normal, S2 normal, normal heart sounds and intact distal pulses.  Exam reveals no gallop and no friction rub.   No murmur heard. Pulmonary/Chest: Effort normal and breath sounds normal. No respiratory distress. She has no wheezes. She has no rales. She exhibits no tenderness.  Abdominal: Soft. Bowel sounds are normal. She exhibits no distension. There is no tenderness.  Musculoskeletal: Normal range of motion. She exhibits no edema and no tenderness.  Lymphadenopathy:    She has  no cervical adenopathy.  Neurological: She is alert and oriented to person, place, and time. Coordination normal.  Skin: Skin is warm and dry. No rash noted. No erythema.  Psychiatric: She has a normal mood and affect. Her behavior is normal. Judgment and thought content normal.    Assessment and Plan

## 2012-12-13 NOTE — Assessment & Plan Note (Addendum)
Given her recent workup [facility] showing small left pleural effusion, pericardial effusion, this is concerning for inflammatory process. Unable to exclude viral etiology. She does have a mild sore throat. Recommended she continue on NSAIDs such as Advil or Aleve. Also provided her a prescription for colchicine twice a day and recommended she take this and the NSAIDS for several weeks. No further workup needed at this time.

## 2012-12-13 NOTE — Patient Instructions (Addendum)
You are doing well. No medication changes were made.  Continue on advil or a Aleve (naproxen) for inflammation Ok to add colchicine twice a day for inflammation  Please call us if you have new issues that need to be addressed before your next appt.  Your physician wants you to follow-up in: 6 months.  You will receive a reminder letter in the mail two months in advance. If you don't receive a letter, please call our office to schedule the follow-up appointment.

## 2012-12-13 NOTE — Assessment & Plan Note (Signed)
Blood pressure is well controlled on today's visit. No changes made to the medications. 

## 2013-03-09 ENCOUNTER — Telehealth: Payer: Self-pay | Admitting: *Deleted

## 2013-03-09 NOTE — Telephone Encounter (Signed)
Currently taking hctz and potassium wants to switch this medication, please call patient to discuss

## 2013-03-09 NOTE — Telephone Encounter (Signed)
Spoke w/ pt.  Reports that her PCP, Dr. Gerarda Fraction is closing her office at the end of the month and would like to know if Dr. Mariah Milling can change her meds for her. Would like to know if Dr. Mariah Milling would consider switching her from HCTZ to a potassium-sparing diuretic, as pt's potassium usually runs low. Please advise.   Thank you!

## 2013-03-12 NOTE — Telephone Encounter (Signed)
Would stop the HCTZ, stop the potassium supplement Continue diet with reasonable amount of potassium/citrus  Recheck basic metabolic panel in several weeks' time Suspect her low potassium problem will resolve by stopping HCTZ  Would report blood pressures and we can start additional medications based on blood pressure and lab work

## 2013-03-13 ENCOUNTER — Telehealth: Payer: Self-pay | Admitting: *Deleted

## 2013-03-13 ENCOUNTER — Other Ambulatory Visit: Payer: Self-pay | Admitting: *Deleted

## 2013-03-13 NOTE — Telephone Encounter (Signed)
Has a medication question

## 2013-03-13 NOTE — Telephone Encounter (Signed)
Left message for pt to call back  °

## 2013-03-14 NOTE — Telephone Encounter (Signed)
Spoke w/ pt.  She verbalizes understanding and is agreeable to plan. She will monitor her BP and call the office if it starts increasing. Sched to come in for labs 03/3013 @ 10:30 w/ the understanding that this may be moved up if BP starts to climb.

## 2013-03-22 ENCOUNTER — Telehealth: Payer: Self-pay

## 2013-03-22 NOTE — Telephone Encounter (Signed)
Faxed cardiac clearance for [facility] to [facility] at [phone].

## 2013-03-23 ENCOUNTER — Telehealth: Payer: Self-pay | Admitting: *Deleted

## 2013-03-23 NOTE — Telephone Encounter (Signed)
Need order/clearance for exercise at [facility]

## 2013-03-23 NOTE — Telephone Encounter (Signed)
Faxed 03/22/13.

## 2013-04-03 ENCOUNTER — Other Ambulatory Visit: Payer: BC Managed Care – PPO

## 2013-04-06 ENCOUNTER — Encounter: Payer: BC Managed Care – PPO | Admitting: *Deleted

## 2013-04-11 ENCOUNTER — Telehealth: Payer: Self-pay | Admitting: *Deleted

## 2013-04-11 NOTE — Telephone Encounter (Signed)
Spoke w/ pt.  Informed her that we have not received lab results for her.  She will see that they are resent to our office. Advised her that when we receive results, I will make sure Dr. Mariah MillingGollan gets them and contact her if he recommends any changes to her meds.

## 2013-04-11 NOTE — Telephone Encounter (Signed)
Has Dr. Mariah MillingGollan review lab results from Dr. Myles [facility]. Dr. Mariah MillingGollan wanting to change bp meds. After looking at labs. Please advise patient.

## 2013-04-20 ENCOUNTER — Telehealth: Payer: Self-pay | Admitting: *Deleted

## 2013-04-20 ENCOUNTER — Encounter: Payer: Self-pay | Admitting: *Deleted

## 2013-04-20 NOTE — Telephone Encounter (Signed)
Left message for pt to call back  °

## 2013-04-20 NOTE — Telephone Encounter (Signed)
Patient came by to see if [facility] have been received. She has been off her bp meds for a month now. She would like Dr. Mariah MillingGollan to review them to see if she needs to go back on them. Patient states that she does not feel well.

## 2013-04-26 ENCOUNTER — Encounter: Payer: Self-pay | Admitting: Physician Assistant

## 2013-04-26 ENCOUNTER — Ambulatory Visit (INDEPENDENT_AMBULATORY_CARE_PROVIDER_SITE_OTHER): Payer: BC Managed Care – PPO | Admitting: Physician Assistant

## 2013-04-26 VITALS — BP 128/80 | HR 61 | Ht 65.0 in | Wt 149.0 lb

## 2013-04-26 DIAGNOSIS — R002 Palpitations: Secondary | ICD-10-CM

## 2013-04-26 DIAGNOSIS — R079 Chest pain, unspecified: Secondary | ICD-10-CM

## 2013-04-26 DIAGNOSIS — I10 Essential (primary) hypertension: Secondary | ICD-10-CM

## 2013-04-26 MED ORDER — POTASSIUM CHLORIDE ER 10 MEQ PO TBCR: 10.0000 meq | EXTENDED_RELEASE_TABLET | Freq: Every day | ORAL | Status: DC

## 2013-04-26 MED ORDER — HYDROCHLOROTHIAZIDE 12.5 MG PO CAPS: 12.5000 mg | ORAL_CAPSULE | Freq: Every day | ORAL | Status: DC

## 2013-04-26 NOTE — Patient Instructions (Addendum)
Please take HCTZ and potassium as prescribed. We will check labwork in 1 week.  Please consume potassium-rich foods liberally (fruits, vegetables).   We will see you back in 6 months or sooner for any issues.  Please have cholesterol checked by your primary care provider.

## 2013-04-26 NOTE — Progress Notes (Signed)
Patient ID: Monica Colon, female   DOB: [birth year], 53 y.o.   MRN: [id number]   Date:  04/26/2013   ID:  Monica Colon, DOB Jul 30, 1960, MRN [id number]  PCP:  Cheryl Flash, MD  Primary Cardiologist:  Johnny Bridge, MD  History of Present Illness:  Monica Colon is a 53 y.o. female w/ PMHx s/f h/o pericarditis, HTN, HLD and palpitations who presents for follow-up.  She reported left arm and sharp upper chest pain radiating to her neck and shoulders. She had an ischemic work-up at [facility]- stress echo- revealing no evidence of ischemia. CT-A normal. She had a small pericardial effusion, L>R pleural effusion. She was treated w/ anti-inflammaties and colchicine on follow-up w/ Dr. 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   Chest discomfort has been intermittent and fleeting since then. She recently established w/ a PCP. Labwork revealed positive ANA, ESR 24. RF, CRP, TSH and free T4 WNL. Work-up is ongoing per patient.   She is planning to travel to [place] in February for her husband's 50th birthday. She has been concerned regarding her BPs recently. SBPs occasionally run in the 150-160 range. She has prevously been on Altace, amlodipine and HCTZ. The latter was stopped due to hypokalemia. She reports this had a good effect on BP, while the former meds did not control it as well. BP 128/80 today. HR 61.   EKG: NSR, 61 bpm, diffuse, subtle upsloping ST depressions (unchanged from prior tracings)  Wt Readings from Last 3 Encounters:  04/26/13 149 lb (67.586 kg)  12/13/12 150 lb (68.04 kg)  01/07/12 149 lb (67.586 kg)     Past Medical History  Diagnosis Date  . Heart murmur     at age 89  . Hypertension   . Hyperlipidemia     Current Outpatient Prescriptions  Medication Sig Dispense Refill  . ascorbic acid (C 1000) 1000 MG tablet Take 1,000 mg by mouth daily.       Marland Kitchen aspirin 325 MG tablet Take 325 mg by mouth daily.        . colchicine 0.6 MG tablet  Take 1 tablet (0.6 mg total) by mouth 2 (two) times daily as needed.  60 tablet  3  . Coral Calcium-Magnesium-Vit D 200-100-100 MG-MG-UNIT CAPS Take 2 tablets by mouth daily.        . fish oil-omega-3 fatty acids 1000 MG capsule Take 2 g by mouth daily.        . Multiple Vitamin (MULTIVITAMIN) capsule Take 1 capsule by mouth daily.        . Probiotic Product (ALIGN) 4 MG CAPS Take 1 tablet by mouth daily.        . propranolol (INDERAL) 10 MG tablet Take 1 tablet (10 mg total) by mouth 3 (three) times daily as needed.  90 tablet  6  . ranitidine (ZANTAC) 150 MG capsule Take by mouth as needed.        No current facility-administered medications for this visit.    Allergies:    Allergies  Allergen Reactions  . Penicillins     Rash    Social History:  The patient  reports that she has never smoked. She does not have any smokeless tobacco history on file. She reports that she drinks about 0.6 ounces of alcohol per week. She reports that she does not use illicit drugs.   Family History:  Family History  Problem Relation Age of Onset  . Hyperlipidemia Mother   . Hypertension Mother  Review of Systems: General: negative for chills, fever, night sweats or weight changes.  Cardiovascular:  negative for chest pain, dyspnea on exertion, edema, orthopnea, palpitations, paroxysmal nocturnal dyspnea or shortness of breath Dermatological:  negative for rash Respiratory:  negative for cough or wheezing Urologic:  negative for hematuria Abdominal:  negative for nausea, vomiting, diarrhea, bright red blood per rectum, melena, or hematemesis Neurologic:  negative for visual changes, syncope, or dizziness All other systems reviewed and are otherwise negative except as noted above.  PHYSICAL EXAM: VS:  BP 128/80  Pulse 61  Ht 5' 5" (1.651 m)  Wt 149 lb (67.586 kg)  BMI 24.79 kg/m2 Well nourished, well developed, in no acute distress HEENT: normal, PERRL Neck: no JVD or bruits Cardiac:   normal S1, S2; RRR; no murmur, gallops or rubs Lungs:  clear to auscultation bilaterally, no wheezing, rhonchi or rales Abd: soft, nontender, no hepatomegaly, normoactive BS x 4 quads Ext: no edema, cyanosis or clubbing Skin: warm and dry, cap refill < 2 sec, no cutaneous lesions Neuro:  CNs 2-12 intact, no focal abnormalities noted Musculoskeletal: strength and tone appropriate for age  Psych: normal affect

## 2013-04-27 NOTE — Assessment & Plan Note (Addendum)
Feels well. Minimal fleeting episodes of chest pain c/w prior pericarditis. She knows to take NSAIDs PRN for flares. Continue colchicine for prevention. Continue to follow-up w/ PCP regarding ANA, ESR to r/o autoimmune causes of pericarditis.

## 2013-04-27 NOTE — Assessment & Plan Note (Signed)
After discussing with the patient, she wishes to restart HCTZ. This was limited by hypokalemia in the past. She would like to increase potassium in her diet to mitigate this. Given her demographic, CCBs or diuretics may achieve the greatest BP effect. She had good response w/ HCTZ in the past, but not amlodipine. Will start HCTZ 12.5mg  daily (on 25 previously) and supplement w/ KDur 10 mEq daily. Check BMET next week.

## 2013-05-04 ENCOUNTER — Ambulatory Visit (INDEPENDENT_AMBULATORY_CARE_PROVIDER_SITE_OTHER): Payer: BC Managed Care – PPO

## 2013-05-04 DIAGNOSIS — I10 Essential (primary) hypertension: Secondary | ICD-10-CM

## 2013-05-05 LAB — BASIC METABOLIC PANEL
BUN/Creatinine Ratio: 17 (ref 9–23)
BUN: 17 mg/dL (ref 6–24)
CALCIUM: 9.6 mg/dL (ref 8.7–10.2)
CO2: 28 mmol/L (ref 18–29)
CREATININE: 1.02 mg/dL — AB (ref 0.57–1.00)
Chloride: 97 mmol/L (ref 97–108)
GFR calc Af Amer: 73 mL/min/{1.73_m2} (ref >59)
GFR calc non Af Amer: 63 mL/min/{1.73_m2} (ref >59)
Glucose: 86 mg/dL (ref 65–99)
Potassium: 4.3 mmol/L (ref 3.5–5.2)
Sodium: 138 mmol/L (ref 134–144)

## 2013-05-15 NOTE — Progress Notes (Signed)
This encounter was created in error - please disregard.

## 2013-06-12 ENCOUNTER — Ambulatory Visit: Payer: BC Managed Care – PPO | Admitting: Cardiovascular Disease

## 2013-10-03 ENCOUNTER — Other Ambulatory Visit: Payer: Self-pay

## 2013-10-03 DIAGNOSIS — Z1231 Encounter for screening mammogram for malignant neoplasm of breast: Secondary | ICD-10-CM

## 2013-11-02 ENCOUNTER — Encounter: Payer: Self-pay | Admitting: Cardiovascular Disease

## 2013-11-09 ENCOUNTER — Ambulatory Visit: Payer: BC Managed Care – PPO

## 2013-11-09 ENCOUNTER — Ambulatory Visit
Admission: RE | Admit: 2013-11-09 | Discharge: 2013-11-09 | Disposition: A | Discharge status: Home / Self Care | Payer: BC Managed Care – PPO | Source: Ambulatory Visit

## 2013-11-09 DIAGNOSIS — Z1231 Encounter for screening mammogram for malignant neoplasm of breast: Secondary | ICD-10-CM

## 2014-01-22 ENCOUNTER — Encounter: Payer: Self-pay | Admitting: Cardiovascular Disease

## 2014-04-19 ENCOUNTER — Encounter: Payer: Self-pay | Admitting: Cardiovascular Disease

## 2014-04-19 ENCOUNTER — Ambulatory Visit (INDEPENDENT_AMBULATORY_CARE_PROVIDER_SITE_OTHER): Payer: BC Managed Care – PPO | Admitting: Cardiovascular Disease

## 2014-04-19 VITALS — BP 120/80 | HR 57 | Ht 65.0 in | Wt 138.2 lb

## 2014-04-19 DIAGNOSIS — I10 Essential (primary) hypertension: Secondary | ICD-10-CM

## 2014-04-19 DIAGNOSIS — I309 Acute pericarditis, unspecified: Secondary | ICD-10-CM

## 2014-04-19 DIAGNOSIS — E785 Hyperlipidemia, unspecified: Secondary | ICD-10-CM

## 2014-04-19 DIAGNOSIS — R002 Palpitations: Secondary | ICD-10-CM

## 2014-04-19 DIAGNOSIS — R079 Chest pain, unspecified: Secondary | ICD-10-CM

## 2014-04-19 DIAGNOSIS — R700 Elevated erythrocyte sedimentation rate: Secondary | ICD-10-CM

## 2014-04-19 DIAGNOSIS — R0789 Other chest pain: Secondary | ICD-10-CM

## 2014-04-19 NOTE — Assessment & Plan Note (Signed)
She reports a new chest fullness up in her left clavicular area, upper mediastinal area coming on with heavy exertion. We have offered routine treadmill study, stress echo. She did not want to schedule these at this time. Prior studies were centrally benign.  If symptoms get worse, recommended that she call our office. EKG is normal on today's visit

## 2014-04-19 NOTE — Assessment & Plan Note (Signed)
Encouraged her to continue to take propranolol as needed for palpitations. She does not take this on a regular basis

## 2014-04-19 NOTE — Progress Notes (Signed)
Patient ID: Monica Colon, female    DOB: July 19, 1960, 54 y.o.   MRN: [id number]  HPI Comments: 54 year old woman with a history of palpitations and fluttering in her chest back in 2009 also with a history of hypertension, hyperlipidemia, previously seen for left arm pain. Diagnosed with pericarditis in 2014 with abnormal EKG, diffuse T-wave abnormality, treated with colchicine and NSAIDs. Also takes propranolol for PVCs/palpitations symptoms  On today's visit she reports having a fullness in her upper chest on the left when she exerts herself. Does not happen at rest, only happens with heavy exertion. She had a previous stress echocardiogram at [facility] in the past which was reportedly normal She is uncertain what the symptoms could be from and wonders if it may or may not be hard. If she does stairs or jumps up and down she is able to feel the symptoms, does not feel that with low-grade exercise  Sedimentation rate typically running 32-38. She has seen rheumatology before. No further workup Total cholesterol 157  EKG on today's visit shows normal sinus rhythm with rate 57 bpm, nonspecific ST abnormality though otherwise benign  Other past medical history He presents today and reports having recent chest pain symptoms radiating up in her clavicle and neck area. August 22 she went to [facility]. Workup in the emergency room with negative cardiac enzymes, negative chest x-ray. She was given GI cocktail.   Previously seen in the emergency room at [facility]. Potassium was 3.1. She had a second visit to [facility]. Over the course of her 2 visits, she had significant testing including stress echo that showed no wall motion abnormality, chest x-rays, chest CT scan showing left greater than right pleural effusion with small pericardial effusion, atelectasis the left, neck CTA which was essentially normal. Notes indicate she was instructed to take anti-inflammatories  Initial EKG at [facility] August 20  13,014 showed no significant ST-T wave changes Potassium at [facility] was 3.3  Since her discharge from [facility], she has continued to have low-grade discomfort, improved with NSAIDs such as Advil.  Other blood work of note that at [facility] included elevated ESR and CRP. CRP was 16, sedimentation rate 100. Normal cardiac enzymes  Allergies  Allergen Reactions  . Penicillins     Rash    Outpatient Encounter Prescriptions as of 04/19/2014  Medication Sig  . ascorbic acid (C 1000) 1000 MG tablet Take 1,000 mg by mouth daily.   Marland Kitchen aspirin 325 MG tablet Take 325 mg by mouth daily.    Marland Kitchen Coral Calcium-Magnesium-Vit D 200-100-100 MG-MG-UNIT CAPS Take 2 tablets by mouth daily.    Marland Kitchen ezetimibe-simvastatin (VYTORIN) 10-10 MG per tablet Take 0.5 tablets by mouth at bedtime.  . fish oil-omega-3 fatty acids 1000 MG capsule Take 2 g by mouth daily.    . hydrochlorothiazide (MICROZIDE) 12.5 MG capsule Take 1 capsule (12.5 mg total) by mouth daily.  . Multiple Vitamin (MULTIVITAMIN) capsule Take 1 capsule by mouth daily.    Marland Kitchen omeprazole (PRILOSEC) 40 MG capsule Take 40 mg by mouth daily.  . potassium chloride (K-DUR) 10 MEQ tablet Take 1 tablet (10 mEq total) by mouth daily.  . Probiotic Product (ALIGN) 4 MG CAPS Take 1 tablet by mouth daily.    . propranolol (INDERAL) 10 MG tablet Take 1 tablet (10 mg total) by mouth 3 (three) times daily as needed.  . [DISCONTINUED] colchicine 0.6 MG tablet Take 1 tablet (0.6 mg total) by mouth 2 (two) times daily as needed. (  Patient not taking: Reported on 04/19/2014)  . [DISCONTINUED] ranitidine (ZANTAC) 150 MG capsule Take by mouth as needed.     Past Medical History  Diagnosis Date  . Heart murmur     at age 54  . Hypertension   . Hyperlipidemia     Past Surgical History  Procedure Laterality Date  . Tonsillectomy      Social History  reports that she has never smoked. She does not have any smokeless tobacco history on file. She reports that she  drinks about 0.6 oz of alcohol per week. She reports that she does not use illicit drugs.  Family History family history includes Hyperlipidemia in her mother; Hypertension in her mother.  Review of Systems  Constitutional: Negative.   HENT: Negative.   Eyes: Negative.   Cardiovascular: Positive for chest pain.  Gastrointestinal: Negative.   Musculoskeletal: Negative.        Left arm and forearm pain  Skin: Negative.   Neurological: Negative.        Felt hot  Psychiatric/Behavioral: Negative.   All other systems reviewed and are negative.   BP 120/80 mmHg  Pulse 57  Ht 5' 5"  (1.651 m)  Wt 138 lb 4 oz (62.71 kg)  BMI 23.01 kg/m2  Physical Exam  Constitutional: She is oriented to person, place, and time. She appears well-developed and well-nourished.  HENT:  Head: Normocephalic.  Nose: Nose normal.  Mouth/Throat: Oropharynx is clear and moist.  Eyes: Conjunctivae are normal. Pupils are equal, round, and reactive to light.  Neck: Normal range of motion. Neck supple. No JVD present.  Cardiovascular: Normal rate, regular rhythm, S1 normal, S2 normal, normal heart sounds and intact distal pulses.  Exam reveals no gallop and no friction rub.   No murmur heard. Pulmonary/Chest: Effort normal and breath sounds normal. No respiratory distress. She has no wheezes. She has no rales. She exhibits no tenderness.  Abdominal: Soft. Bowel sounds are normal. She exhibits no distension. There is no tenderness.  Musculoskeletal: Normal range of motion. She exhibits no edema or tenderness.  Lymphadenopathy:    She has no cervical adenopathy.  Neurological: She is alert and oriented to person, place, and time. Coordination normal.  Skin: Skin is warm and dry. No rash noted. No erythema.  Psychiatric: She has a normal mood and affect. Her behavior is normal. Judgment and thought content normal.    Assessment and Plan  Nursing note and vitals reviewed.

## 2014-04-19 NOTE — Assessment & Plan Note (Signed)
Previous symptoms in 2014 with abnormal EKG at that time treated with NSAIDs. EKG is benign, symptoms not concerning for recurrent pericarditis at this time

## 2014-04-19 NOTE — Assessment & Plan Note (Signed)
Cholesterol is at goal on the current lipid regimen. No changes to the medications were made.  

## 2014-04-19 NOTE — Patient Instructions (Signed)
You are doing well. No medication changes were made.  If chest symptoms get worse, Consider taking ibuprofen as needed  Please call us if you have new issues that need to be addressed before your next appt.  Your physician wants you to follow-up in: 12 months.  You will receive a reminder letter in the mail two months in advance. If you don't receive a letter, please call our office to schedule the follow-up appointment.

## 2014-04-19 NOTE — Assessment & Plan Note (Signed)
She reports having elevated sedimentation rate, improved from 2014. Seen by rheumatology, low 30s felt to be her normal range

## 2014-04-19 NOTE — Assessment & Plan Note (Signed)
Blood pressure is well controlled on today's visit. No changes made to the medications. 

## 2014-05-16 ENCOUNTER — Telehealth: Payer: Self-pay | Admitting: *Deleted

## 2014-05-16 MED ORDER — POTASSIUM CHLORIDE ER 10 MEQ PO TBCR: 10.0000 meq | EXTENDED_RELEASE_TABLET | Freq: Every day | ORAL | Status: DC

## 2014-05-16 MED ORDER — HYDROCHLOROTHIAZIDE 12.5 MG PO CAPS: 12.5000 mg | ORAL_CAPSULE | Freq: Every day | ORAL | Status: DC

## 2014-05-16 NOTE — Telephone Encounter (Signed)
°  1. Which medications need to be refilled? Potassium and Hydroclorzine   2. Which pharmacy is medication to be sent to? [facility] [address]  3. Do they need a 30 day or 90 day supply? 90 day   4. Would they like a call back once the medication has been sent to the pharmacy? Yes

## 2014-05-16 NOTE — Telephone Encounter (Signed)
Refill sent for potassium and HCTZ.

## 2014-10-31 ENCOUNTER — Other Ambulatory Visit: Payer: Self-pay

## 2014-10-31 DIAGNOSIS — Z1231 Encounter for screening mammogram for malignant neoplasm of breast: Secondary | ICD-10-CM

## 2014-12-04 ENCOUNTER — Ambulatory Visit
Admission: RE | Admit: 2014-12-04 | Discharge: 2014-12-04 | Disposition: A | Discharge status: Home / Self Care | Payer: BC Managed Care – PPO | Source: Ambulatory Visit

## 2014-12-04 DIAGNOSIS — Z1231 Encounter for screening mammogram for malignant neoplasm of breast: Secondary | ICD-10-CM

## 2014-12-20 ENCOUNTER — Telehealth: Payer: Self-pay | Admitting: Cardiovascular Disease

## 2014-12-20 NOTE — Telephone Encounter (Signed)
Pt came in to the office while I was at lunch. She was advised by the front desk staff to go to the ED or Urgent Care.

## 2014-12-20 NOTE — Telephone Encounter (Signed)
Left message for pt to call back, as I do not see that she went to the ED. Asked her to call back and see if we can get her worked in to see Alycia Rossetti next week.  Advised her to go to the ED if her sx worsen over the weekend.

## 2014-12-20 NOTE — Telephone Encounter (Signed)
Patient walked into office for EKG.  Says she feels like she did before in the past when she had heart issues? PE?   Wants and EKG today.   Says she feels a little dizziness, pain toward L clavicle /neck area.  Faint/weak feeling.

## 2015-03-27 ENCOUNTER — Encounter: Payer: Self-pay | Admitting: Cardiovascular Disease

## 2015-04-25 ENCOUNTER — Ambulatory Visit (INDEPENDENT_AMBULATORY_CARE_PROVIDER_SITE_OTHER): Payer: BC Managed Care – PPO | Admitting: Cardiovascular Disease

## 2015-04-25 ENCOUNTER — Encounter: Payer: Self-pay | Admitting: Cardiovascular Disease

## 2015-04-25 VITALS — BP 135/85 | HR 58 | Ht 65.0 in | Wt 140.8 lb

## 2015-04-25 DIAGNOSIS — E785 Hyperlipidemia, unspecified: Secondary | ICD-10-CM | POA: Diagnosis not present

## 2015-04-25 DIAGNOSIS — R079 Chest pain, unspecified: Secondary | ICD-10-CM | POA: Diagnosis not present

## 2015-04-25 DIAGNOSIS — R002 Palpitations: Secondary | ICD-10-CM | POA: Diagnosis not present

## 2015-04-25 DIAGNOSIS — I10 Essential (primary) hypertension: Secondary | ICD-10-CM

## 2015-04-25 MED ORDER — HYDROCHLOROTHIAZIDE 12.5 MG PO CAPS: 12.5000 mg | ORAL_CAPSULE | Freq: Every day | ORAL | Status: DC

## 2015-04-25 NOTE — Assessment & Plan Note (Signed)
Atypical chest pain symptoms No further workup at this time

## 2015-04-25 NOTE — Assessment & Plan Note (Signed)
Suggested she take her propranolol as needed. Symptoms are relatively stable

## 2015-04-25 NOTE — Patient Instructions (Signed)
You are doing well.  If you continue to have cough, Stop the lisinopril, Start losartan 1/2 pill daily If blood pressure runs high after a week, Go to a full pill daily  Stop potassium  Please call us if you have new issues that need to be addressed before your next appt.  Your physician wants you to follow-up in: 12 months.  You will receive a reminder letter in the mail two months in advance. If you don't receive a letter, please call our office to schedule the follow-up appointment.

## 2015-04-25 NOTE — Assessment & Plan Note (Signed)
Most recent lipid panel not available Currently on vytorin

## 2015-04-25 NOTE — Progress Notes (Signed)
Patient ID: Monica Colon, female    DOB: 03/14/1961, 55 y.o.   MRN: [id number]  HPI Comments: 55 year old woman with a history of palpitations and fluttering in her chest back in 2009 also with a history of hypertension, hyperlipidemia, previously seen for left arm pain. She presents today for follow-up of her palpitations Diagnosed with pericarditis in 2014 with abnormal EKG, diffuse T-wave abnormality, treated with colchicine and NSAIDs. Also takes propranolol for PVCs/palpitations symptoms  In follow-up, she reports that there has been some medication changes. It was recommended by primary care that she stopped her HCTZ for creatinine 1.2. She was started on lisinopril 5 mg. She reports this gave her a headache, also she developed leg edema. She went back on HCTZ every other day. Recent lab work shows normal renal function. She is not taking potassium Rarely has episode of chest pain at rest, not with exertion  She takes propranolol as needed for palpitations. In general these have been relatively well-controlled She does report having low heart beats in the past just on routine check using her phone Asymptomatic at the time  EKG on today's visit shows normal sinus rhythm with rate 58 bpm, no significant ST or T-wave changes  Other past medical history reviewed Previously hadchest pain symptoms radiating up in her clavicle and neck area.  went to [facility]. Workup in the emergency room with negative cardiac enzymes, negative chest x-ray. She was given GI cocktail.   Previously seen in the emergency room at [facility]. Potassium was 3.1. She had a second visit to [facility]. Over the course of her 2 visits, she had significant testing including stress echo that showed no wall motion abnormality, chest x-rays, chest CT scan showing left greater than right pleural effusion with small pericardial effusion, atelectasis the left, neck CTA which was essentially normal. Notes indicate she was  instructed to take anti-inflammatories  Initial EKG at [facility] August 20 13,014 showed no significant ST-T wave changes Potassium at [facility] was 3.3  Since her discharge from [facility], she has continued to have low-grade discomfort, improved with NSAIDs such as Advil.  Other blood work of note that at [facility] included elevated ESR and CRP. CRP was 16, sedimentation rate 100. Normal cardiac enzymes  Allergies  Allergen Reactions  . Penicillins     Rash    Outpatient Encounter Prescriptions as of 04/25/2015  Medication Sig  . ascorbic acid (C 1000) 1000 MG tablet Take 1,000 mg by mouth daily.   Marland Kitchen aspirin 81 MG tablet Take 81 mg by mouth daily.  Marland Kitchen Coral Calcium-Magnesium-Vit D 200-100-100 MG-MG-UNIT CAPS Take 2 tablets by mouth daily.    Marland Kitchen ezetimibe-simvastatin (VYTORIN) 10-10 MG per tablet Take 0.5 tablets by mouth at bedtime.  . fish oil-omega-3 fatty acids 1000 MG capsule Take 2 g by mouth daily.    . hydrochlorothiazide (MICROZIDE) 12.5 MG capsule Take 1 capsule (12.5 mg total) by mouth daily.  Marland Kitchen lisinopril (PRINIVIL,ZESTRIL) 5 MG tablet Take 5 mg by mouth daily.  . Multiple Vitamin (MULTIVITAMIN) capsule Take 1 capsule by mouth daily.    . Probiotic Product (ALIGN) 4 MG CAPS Take 1 tablet by mouth daily.    . propranolol (INDERAL) 10 MG tablet Take 1 tablet (10 mg total) by mouth 3 (three) times daily as needed.  . [DISCONTINUED] hydrochlorothiazide (MICROZIDE) 12.5 MG capsule Take 1 capsule (12.5 mg total) by mouth daily.  . [DISCONTINUED] potassium chloride (K-DUR) 10 MEQ tablet Take 1 tablet (10 mEq total)  by mouth daily.  . [DISCONTINUED] aspirin 325 MG tablet Take 325 mg by mouth daily. Reported on 04/25/2015  . [DISCONTINUED] omeprazole (PRILOSEC) 40 MG capsule Take 40 mg by mouth daily. Reported on 04/25/2015   No facility-administered encounter medications on file as of 04/25/2015.    Past Medical History  Diagnosis Date  . Heart murmur     at age 13  . Hypertension    . Hyperlipidemia     Past Surgical History  Procedure Laterality Date  . Tonsillectomy      Social History  reports that she has never smoked. She does not have any smokeless tobacco history on file. She reports that she drinks about 0.6 oz of alcohol per week. She reports that she does not use illicit drugs.  Family History family history includes Hyperlipidemia in her mother; Hypertension in her mother.  Review of Systems  Constitutional: Negative.   Cardiovascular: Positive for chest pain.  Gastrointestinal: Negative.   Musculoskeletal: Negative.        Left arm and forearm pain  Neurological: Negative.        Felt hot  Psychiatric/Behavioral: Negative.   All other systems reviewed and are negative.   BP 135/85 mmHg  Pulse 58  Ht 5' 5"  (1.651 m)  Wt 140 lb 12 oz (63.844 kg)  BMI 23.42 kg/m2  Physical Exam  Constitutional: She is oriented to person, place, and time. She appears well-developed and well-nourished.  HENT:  Head: Normocephalic.  Nose: Nose normal.  Mouth/Throat: Oropharynx is clear and moist.  Eyes: Conjunctivae are normal. Pupils are equal, round, and reactive to light.  Neck: Normal range of motion. Neck supple. No JVD present.  Cardiovascular: Normal rate, regular rhythm, S1 normal, S2 normal, normal heart sounds and intact distal pulses.  Exam reveals no gallop and no friction rub.   No murmur heard. Pulmonary/Chest: Effort normal and breath sounds normal. No respiratory distress. She has no wheezes. She has no rales. She exhibits no tenderness.  Abdominal: Soft. Bowel sounds are normal. She exhibits no distension. There is no tenderness.  Musculoskeletal: Normal range of motion. She exhibits no edema or tenderness.  Lymphadenopathy:    She has no cervical adenopathy.  Neurological: She is alert and oriented to person, place, and time. Coordination normal.  Skin: Skin is warm and dry. No rash noted. No erythema.  Psychiatric: She has a normal mood  and affect. Her behavior is normal. Judgment and thought content normal.    Assessment and Plan  Nursing note and vitals reviewed.

## 2015-04-25 NOTE — Assessment & Plan Note (Signed)
She is concerned about a cough and lisinopril Suggested she continue with her lisinopril for several more weeks until she is sure. If she continues to have a cough, recommended she change to losartan 25 mill grams daily. If blood pressure runs high, would increase up to 50 mg daily Would stay on HCTZ every other day Suggested she hold the potassium as she is doing

## 2015-08-13 ENCOUNTER — Other Ambulatory Visit: Payer: Self-pay | Admitting: Gastroenterology

## 2015-08-13 DIAGNOSIS — R1310 Dysphagia, unspecified: Secondary | ICD-10-CM

## 2015-08-22 ENCOUNTER — Ambulatory Visit
Admission: RE | Admit: 2015-08-22 | Discharge: 2015-08-22 | Disposition: A | Discharge status: Home / Self Care | Payer: BC Managed Care – PPO | Source: Ambulatory Visit | Attending: Gastroenterology | Admitting: Gastroenterology

## 2015-08-22 DIAGNOSIS — R1310 Dysphagia, unspecified: Secondary | ICD-10-CM

## 2015-08-22 IMAGING — RF DG ESOPHAGUS
6 of 7 series · 19 of 24 positions shown · non-contrast
Comparison: none

CLINICAL DATA: Dysphagia

EXAM:
ESOPHOGRAM / BARIUM SWALLOW / BARIUM TABLET STUDY
TECHNIQUE: Combined double contrast and single contrast examination performed
using effervescent crystals, thick barium liquid, and thin barium
liquid. The patient was observed with fluoroscopy swallowing a 13 mm
barium sulphate tablet.
FLUOROSCOPY TIME:  Radiation Exposure Index (as provided by the
fluoroscopic device):
If the device does not provide the exposure index:
Fluoroscopy Time:  1 minutes 36 second
Number of Acquired Images:

[Series 1: run · 14 of 22 slices shown (1 of 6)]
[im 1/22]
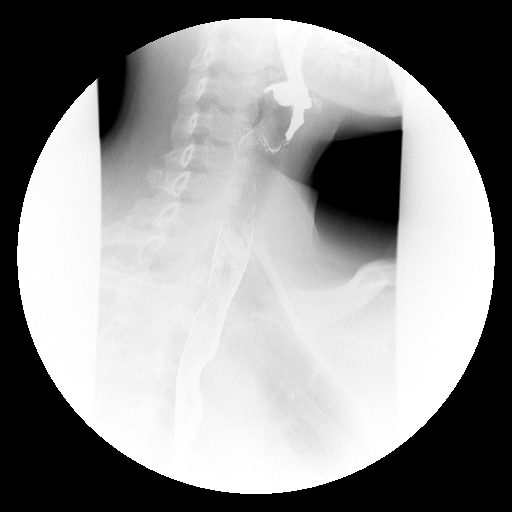
[im 2/22]
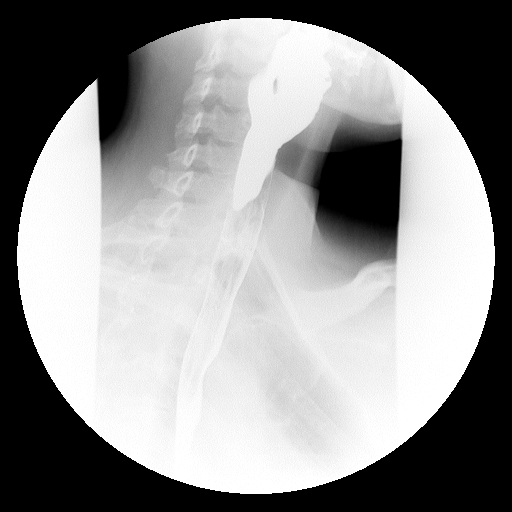
[im 4/22]
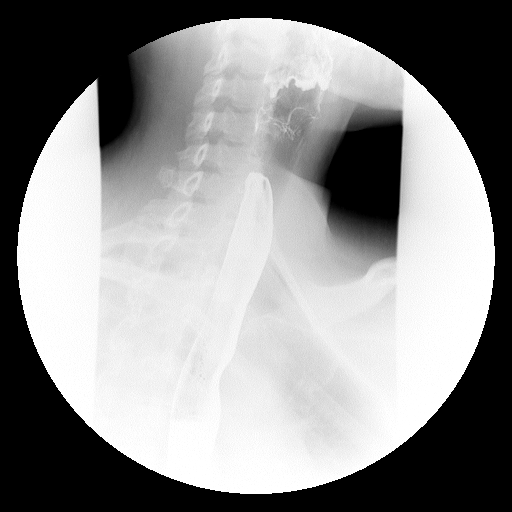
[im 5/22]
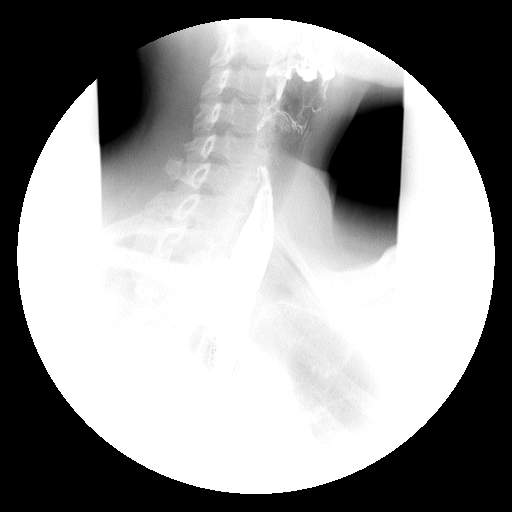
[im 7/22]
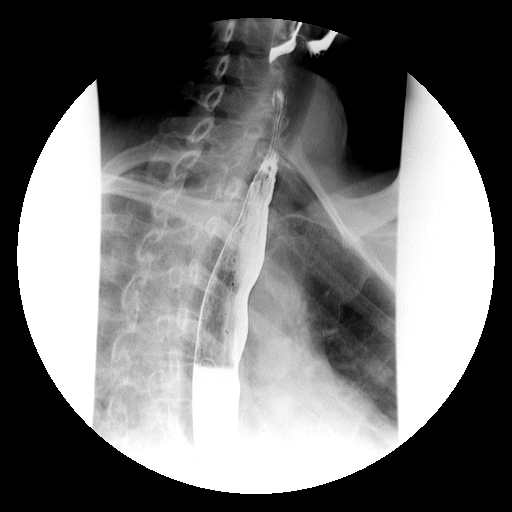
[im 8/22]
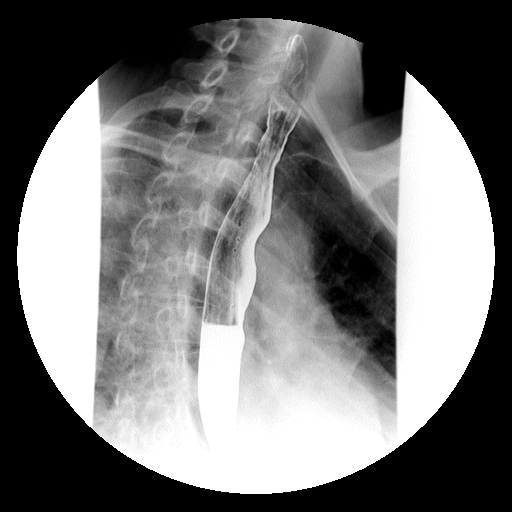
[im 10/22]
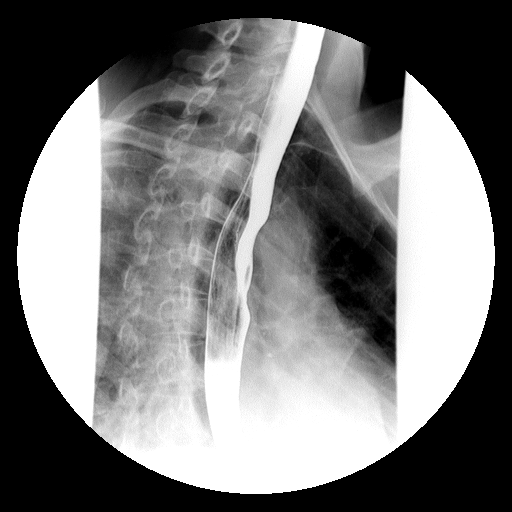
[im 12/22]
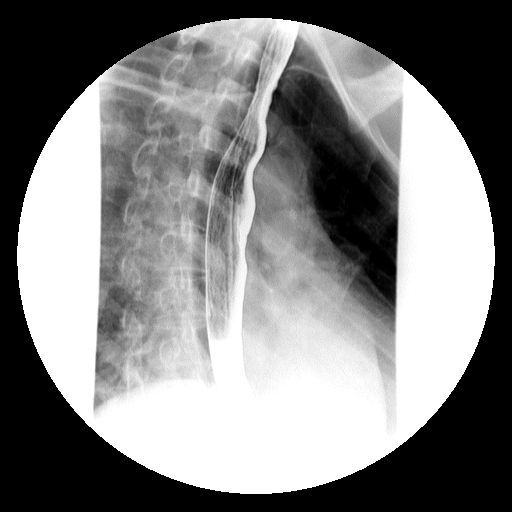
[im 13/22]
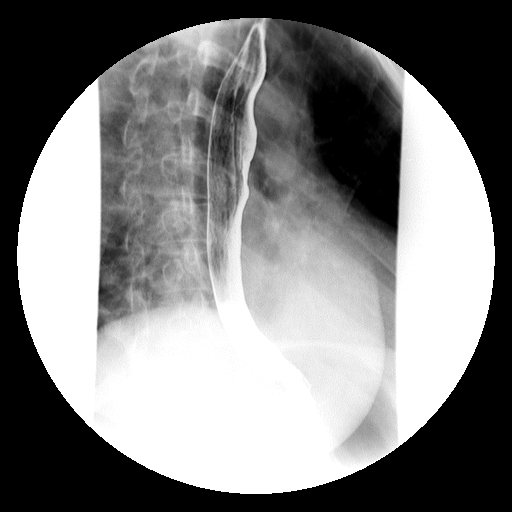
[im 15/22]
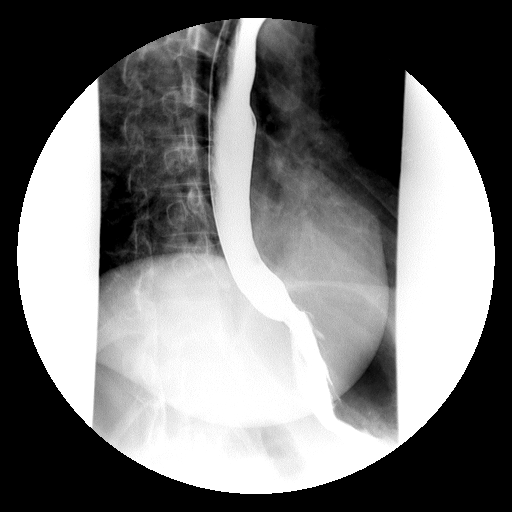
[im 17/22]
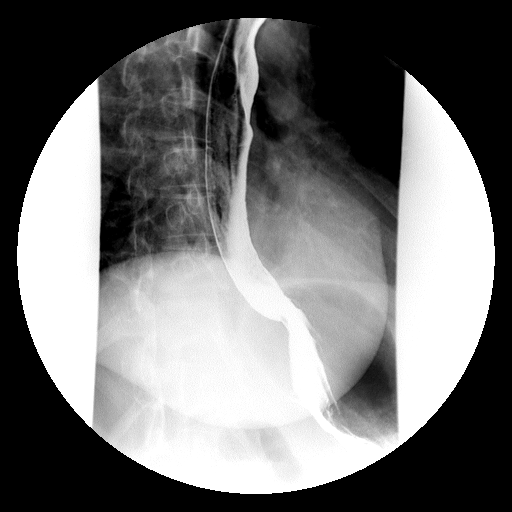
[im 18/22]
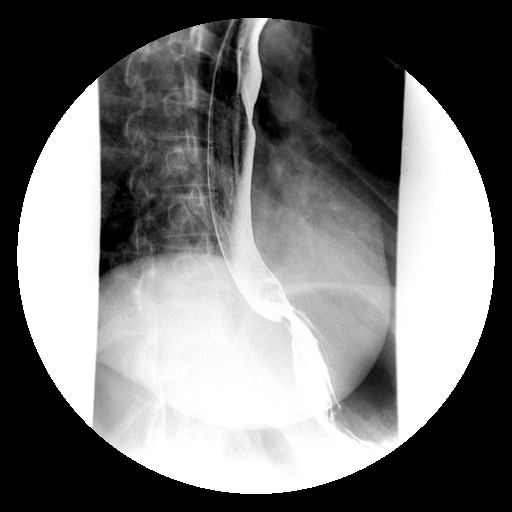
[im 19/22]
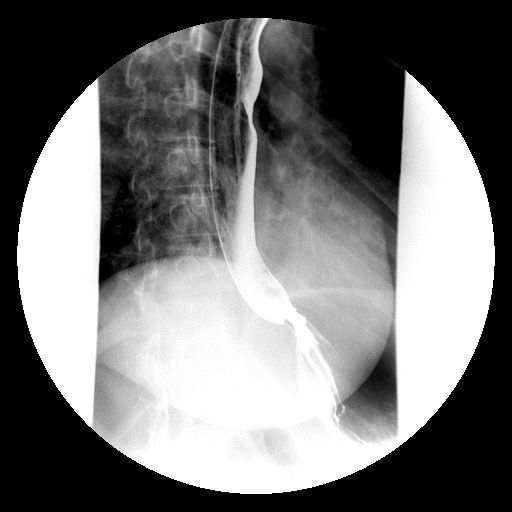
[im 22/22]
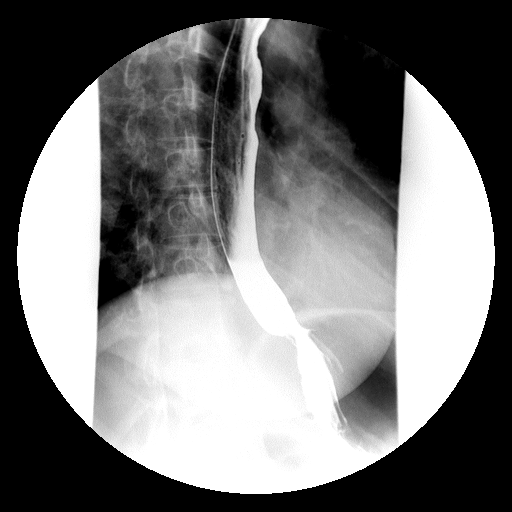

[Series 2: run · 1 of 1 slices shown (2 of 6)]
[im 1/1]
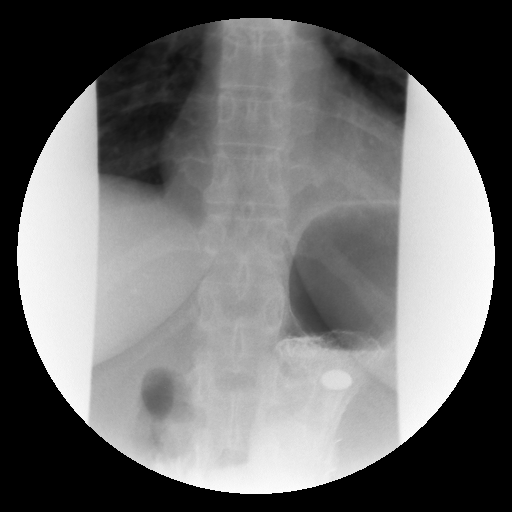

[Series 3: run · 1 of 1 slices shown (3 of 6)]
[im 1/1]
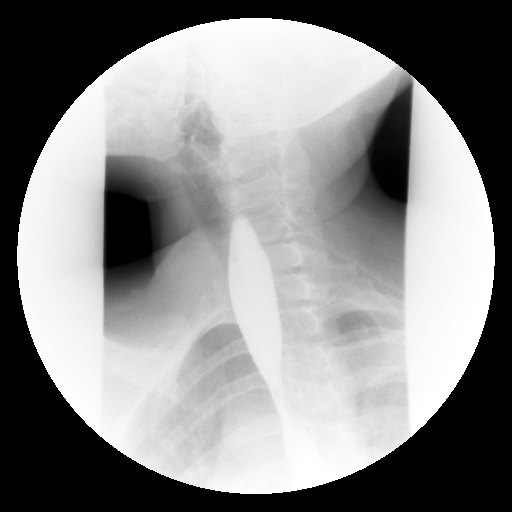

[Series 4: run · 1 of 1 slices shown (4 of 6)]
[im 1/1]
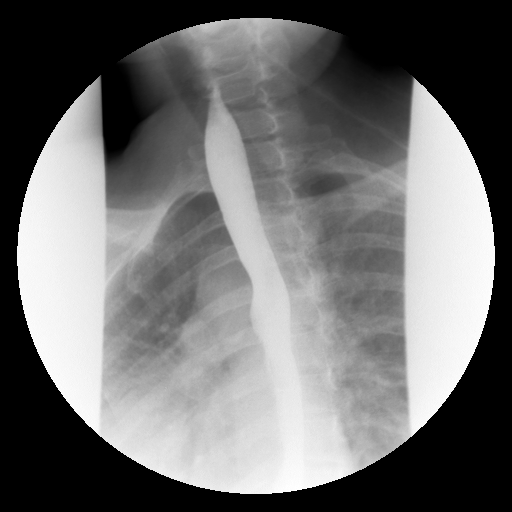

[Series 6: run · 1 of 1 slices shown (5 of 6)]
[im 1/1]
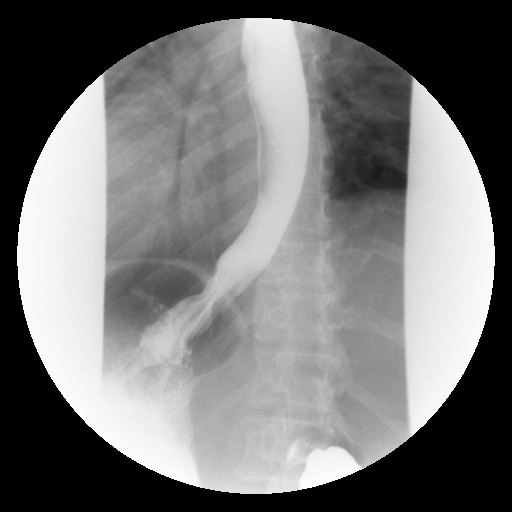

[Series 7: run · 1 of 1 slices shown (6 of 6)]
[im 1/1]
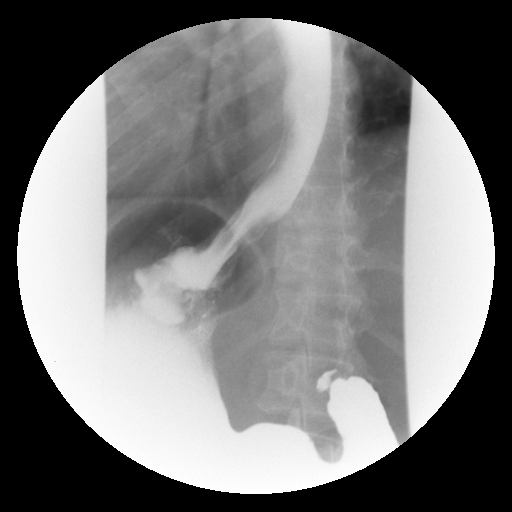

[19 of 24 positions shown; findings below may reference images not displayed]

FINDINGS: Esophageal mucosa and motility normal. No stricture or mass. No
ulceration.

Negative for hiatal hernia.  No gastroesophageal reflux.

Barium tablet passed readily into the stomach without delay.
IMPRESSION: Negative

## 2015-11-24 ENCOUNTER — Other Ambulatory Visit: Payer: Self-pay | Admitting: Family Medicine

## 2015-11-24 DIAGNOSIS — Z1231 Encounter for screening mammogram for malignant neoplasm of breast: Secondary | ICD-10-CM

## 2015-12-18 ENCOUNTER — Ambulatory Visit
Admission: RE | Admit: 2015-12-18 | Discharge: 2015-12-18 | Disposition: A | Discharge status: Home / Self Care | Payer: BC Managed Care – PPO | Source: Ambulatory Visit | Attending: Family Medicine | Admitting: Family Medicine

## 2015-12-18 DIAGNOSIS — Z1231 Encounter for screening mammogram for malignant neoplasm of breast: Secondary | ICD-10-CM

## 2016-05-07 ENCOUNTER — Other Ambulatory Visit: Payer: Self-pay | Admitting: Cardiovascular Disease

## 2016-09-08 NOTE — Progress Notes (Signed)
Cardiology Office Note  Date:  09/10/2016   ID:  Monica Colon, DOB [birth year], MRN [id number]  PCP:  Clarisse Gouge, MD   Chief Complaint  Patient presents with  . other    1 yr f/u no complaints today. Meds reviewed verbally with pt.    HPI:   56 year old woman with a history of  palpitations and fluttering in her chest back in 2009  hypertension,  hyperlipidemia,  previously seen for left arm pain.  Diagnosed with pericarditis in 2014 with abnormal EKG, diffuse T-wave abnormality, treated with colchicine and NSAIDs. presents today for follow-up of her palpitations  4 to 5 months was having palpitations, Was taking B12 "other things going on"  Lab work reviewed with her in detail Total chol 163, LDL 87  on losartan for BP, dose increased up to 50 mg daily by primary care HCTZ QOD Potassium 3.9  Rarely has episode of chest pain at rest, not with exertion Does not feel it is a problem  She takes propranolol as needed for palpitations.   EKG on today's visit shows normal sinus rhythm with rate 56 bpm, no significant ST or T-wave changes  Other past medical history reviewed Previously hadchest pain symptoms radiating up in her clavicle and neck area.  went to [facility]. Workup in the emergency room with negative cardiac enzymes, negative chest x-ray. She was given GI cocktail.   Previously seen in the emergency room at [facility]. Potassium was 3.1. She had a second visit to [facility]. Over the course of her 2 visits, she had significant testing including stress echo that showed no wall motion abnormality, chest x-rays, chest CT scan showing left greater than right pleural effusion with small pericardial effusion, atelectasis the left, neck CTA which was essentially normal. Notes indicate she was instructed to take anti-inflammatories  Initial EKG at [facility] August 20 13,014 showed no significant ST-T wave changes Potassium at [facility] was 3.3  Since her discharge from  [facility], she has continued to have low-grade discomfort, improved with NSAIDs such as Advil.  Other blood work of note that at [facility] included elevated ESR and CRP. CRP was 16, sedimentation rate 100. Normal cardiac enzymes   PMH:   has a past medical history of Heart murmur; Hyperlipidemia; and Hypertension.  PSH:    Past Surgical History:  Procedure Laterality Date  . TONSILLECTOMY      Current Outpatient Prescriptions  Medication Sig Dispense Refill  . ascorbic acid (C 1000) 1000 MG tablet Take 1,000 mg by mouth daily.     Marland Kitchen aspirin 81 MG tablet Take 81 mg by mouth daily.    Marland Kitchen Coral Calcium-Magnesium-Vit D 200-100-100 MG-MG-UNIT CAPS Take 2 tablets by mouth daily.      Marland Kitchen ezetimibe-simvastatin (VYTORIN) 10-10 MG per tablet Take 0.5 tablets by mouth at bedtime.    . fish oil-omega-3 fatty acids 1000 MG capsule Take 2 g by mouth daily.      . hydrochlorothiazide (MICROZIDE) 12.5 MG capsule take 1 capsule by mouth once daily 90 capsule 3  . losartan (COZAAR) 50 MG tablet Take 1 tablet (50 mg total) by mouth daily.    . Multiple Vitamin (MULTIVITAMIN) capsule Take 1 capsule by mouth daily.      . Probiotic Product (ALIGN) 4 MG CAPS Take 1 tablet by mouth daily.      . propranolol (INDERAL) 10 MG tablet Take 1 tablet (10 mg total) by mouth 3 (three) times daily as needed. 90 tablet  6  . famotidine (PEPCID) 40 MG tablet Take 1 tablet (40 mg total) by mouth daily.     No current facility-administered medications for this visit.      Allergies:   Penicillins   Social History:  The patient  reports that she has never smoked. She has never used smokeless tobacco. She reports that she drinks about 0.6 oz of alcohol per week . She reports that she does not use drugs.   Family History:   family history includes Hyperlipidemia in her mother; Hypertension in her mother.    Review of Systems: Review of Systems  Constitutional: Negative.   Respiratory: Negative.    Cardiovascular: Positive for palpitations.  Gastrointestinal: Negative.   Musculoskeletal: Negative.   Neurological: Negative.   Psychiatric/Behavioral: Negative.   All other systems reviewed and are negative.    PHYSICAL EXAM: VS:  BP 116/80 (BP Location: Left Arm, Patient Position: Sitting, Cuff Size: Normal)   Pulse (!) 56   Ht 5' 5"  (1.651 m)   Wt 150 lb 8 oz (68.3 kg)   BMI 25.04 kg/m  , BMI Body mass index is 25.04 kg/m.  GEN: Well nourished, well developed, in no acute distress  HEENT: normal  Neck: no JVD, carotid bruits, or masses Cardiac: RRR; no murmurs, rubs, or gallops,no edema  Respiratory:  clear to auscultation bilaterally, normal work of breathing GI: soft, nontender, nondistended, + BS MS: no deformity or atrophy  Skin: warm and dry, no rash Neuro:  Strength and sensation are intact Psych: euthymic mood, full affect    Recent Labs: No results found for requested labs within last 8760 hours.    Lipid Panel No results found for: CHOL, HDL, LDLCALC, TRIG    Wt Readings from Last 3 Encounters:  09/10/16 150 lb 8 oz (68.3 kg)  04/25/15 140 lb 12 oz (63.8 kg)  04/19/14 138 lb 4 oz (62.7 kg)       ASSESSMENT AND PLAN:  Chest pain, unspecified type - Plan: EKG 12-Lead Atypical symptoms, no further workup, no further issues from pericarditis  Essential hypertension - Plan: EKG 12-Lead Blood pressure is well controlled on today's visit. No changes made to the medications.  Mixed hyperlipidemia - Plan: EKG 12-Lead Cholesterol is at goal on the current lipid regimen. No changes to the medications were made.  Palpitations - Plan: EKG 12-Lead Takes propranolol as needed  Disposition:   F/U  12 months prn   Total encounter time more than 15 minutes  Greater than 50% was spent in counseling and coordination of care with the patient    Orders Placed This Encounter  Procedures  . EKG 12-Lead     Signed, Esmond Plants, M.D., Ph.D. 09/10/2016   [place], [place]

## 2016-09-10 ENCOUNTER — Encounter: Payer: Self-pay | Admitting: Cardiovascular Disease

## 2016-09-10 ENCOUNTER — Ambulatory Visit (INDEPENDENT_AMBULATORY_CARE_PROVIDER_SITE_OTHER): Payer: BC Managed Care – PPO | Admitting: Cardiovascular Disease

## 2016-09-10 VITALS — BP 116/80 | HR 56 | Ht 65.0 in | Wt 150.5 lb

## 2016-09-10 DIAGNOSIS — E782 Mixed hyperlipidemia: Secondary | ICD-10-CM

## 2016-09-10 DIAGNOSIS — I10 Essential (primary) hypertension: Secondary | ICD-10-CM | POA: Diagnosis not present

## 2016-09-10 DIAGNOSIS — R079 Chest pain, unspecified: Secondary | ICD-10-CM | POA: Diagnosis not present

## 2016-09-10 DIAGNOSIS — R002 Palpitations: Secondary | ICD-10-CM | POA: Diagnosis not present

## 2016-09-10 NOTE — Patient Instructions (Addendum)

## 2016-11-12 ENCOUNTER — Other Ambulatory Visit: Payer: Self-pay | Admitting: Family Medicine

## 2016-11-12 DIAGNOSIS — Z1231 Encounter for screening mammogram for malignant neoplasm of breast: Secondary | ICD-10-CM

## 2016-12-23 ENCOUNTER — Ambulatory Visit
Admission: RE | Admit: 2016-12-23 | Discharge: 2016-12-23 | Disposition: A | Discharge status: Home / Self Care | Payer: BC Managed Care – PPO | Source: Ambulatory Visit | Attending: Family Medicine | Admitting: Family Medicine

## 2016-12-23 DIAGNOSIS — Z1231 Encounter for screening mammogram for malignant neoplasm of breast: Secondary | ICD-10-CM

## 2016-12-24 ENCOUNTER — Ambulatory Visit: Payer: BC Managed Care – PPO

## 2017-03-21 ENCOUNTER — Other Ambulatory Visit: Payer: Self-pay

## 2017-03-21 ENCOUNTER — Telehealth: Payer: Self-pay | Admitting: Cardiovascular Disease

## 2017-03-21 ENCOUNTER — Emergency Department: Payer: BC Managed Care – PPO

## 2017-03-21 ENCOUNTER — Emergency Department
Admission: EM | Admit: 2017-03-21 | Discharge: 2017-03-21 | Disposition: A | Discharge status: Home / Self Care | Payer: BC Managed Care – PPO | Attending: Emergency Medicine | Admitting: Emergency Medicine

## 2017-03-21 ENCOUNTER — Encounter: Payer: Self-pay | Admitting: Emergency Medicine

## 2017-03-21 DIAGNOSIS — I10 Essential (primary) hypertension: Secondary | ICD-10-CM | POA: Insufficient documentation

## 2017-03-21 DIAGNOSIS — Z79899 Other long term (current) drug therapy: Secondary | ICD-10-CM | POA: Insufficient documentation

## 2017-03-21 DIAGNOSIS — R079 Chest pain, unspecified: Secondary | ICD-10-CM | POA: Diagnosis present

## 2017-03-21 LAB — CBC
HCT: 42.6 % (ref 35.0–47.0)
Hemoglobin: 14.3 g/dL (ref 12.0–16.0)
MCH: 31.9 pg (ref 26.0–34.0)
MCHC: 33.7 g/dL (ref 32.0–36.0)
MCV: 94.8 fL (ref 80.0–100.0)
PLATELETS: 358 10*3/uL (ref 150–440)
RBC: 4.49 MIL/uL (ref 3.80–5.20)
RDW: 13 % (ref 11.5–14.5)
WBC: 5.1 10*3/uL (ref 3.6–11.0)

## 2017-03-21 LAB — BASIC METABOLIC PANEL
Anion gap: 8 (ref 5–15)
BUN: 16 mg/dL (ref 6–20)
CHLORIDE: 99 mmol/L — AB (ref 101–111)
CO2: 33 mmol/L — AB (ref 22–32)
CREATININE: 0.9 mg/dL (ref 0.44–1.00)
Calcium: 9.5 mg/dL (ref 8.9–10.3)
GFR calc Af Amer: >60 mL/min (ref >60)
GFR calc non Af Amer: >60 mL/min (ref >60)
GLUCOSE: 112 mg/dL — AB (ref 65–99)
POTASSIUM: 3.6 mmol/L (ref 3.5–5.1)
Sodium: 140 mmol/L (ref 135–145)

## 2017-03-21 LAB — TROPONIN I
Troponin I: <0.03 ng/mL (ref <0.03)
Troponin I: <0.03 ng/mL (ref <0.03)

## 2017-03-21 IMAGING — CR DG CHEST 2V
1 series · 2 of 2 positions shown · non-contrast
Comparison: [DATE]

CLINICAL DATA: Chest pain

EXAM:
CHEST  2 VIEW

[Series 1: dg chest 2 view · 0.14mm/px · 2 of 2 slices shown]
[im 1/2]
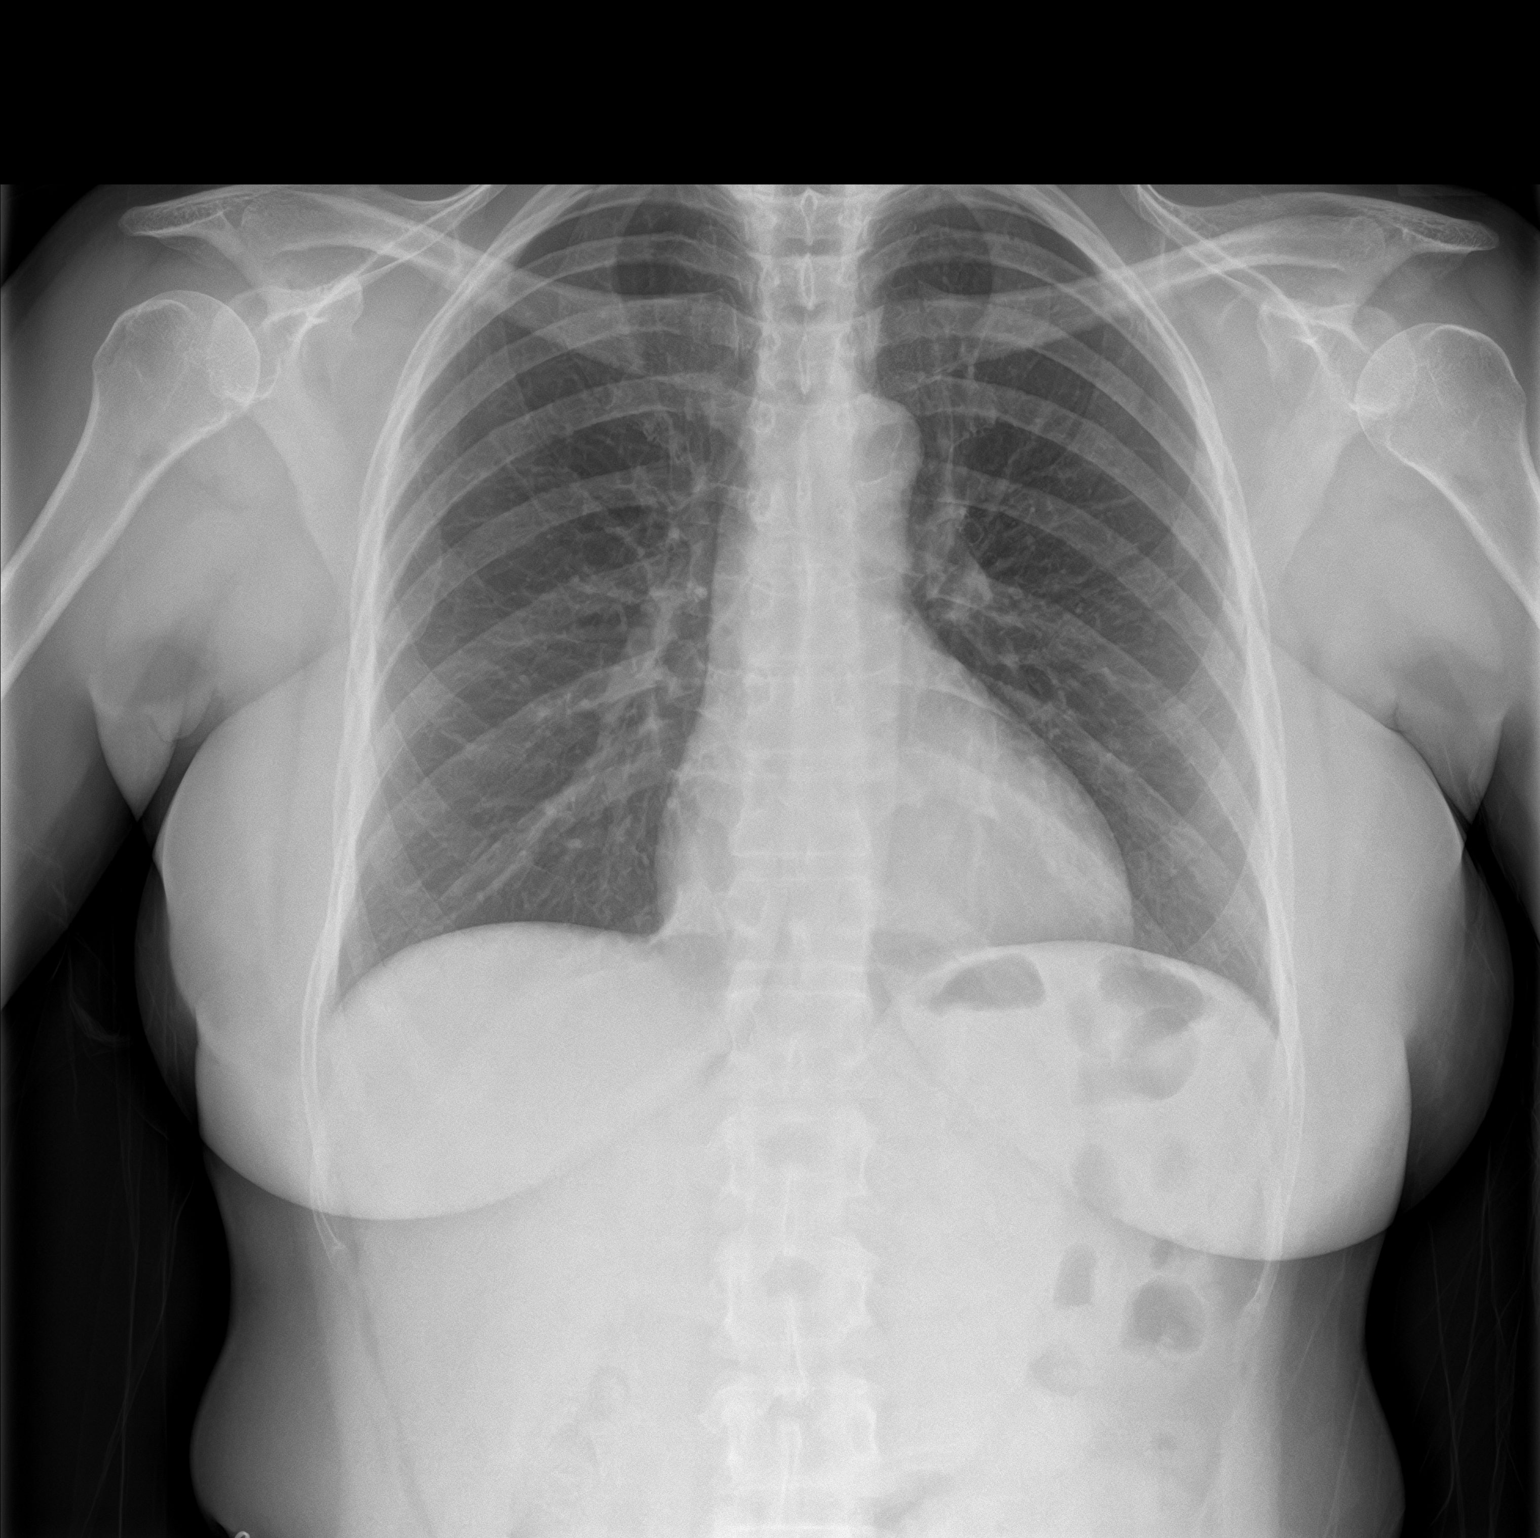
[im 2/2]
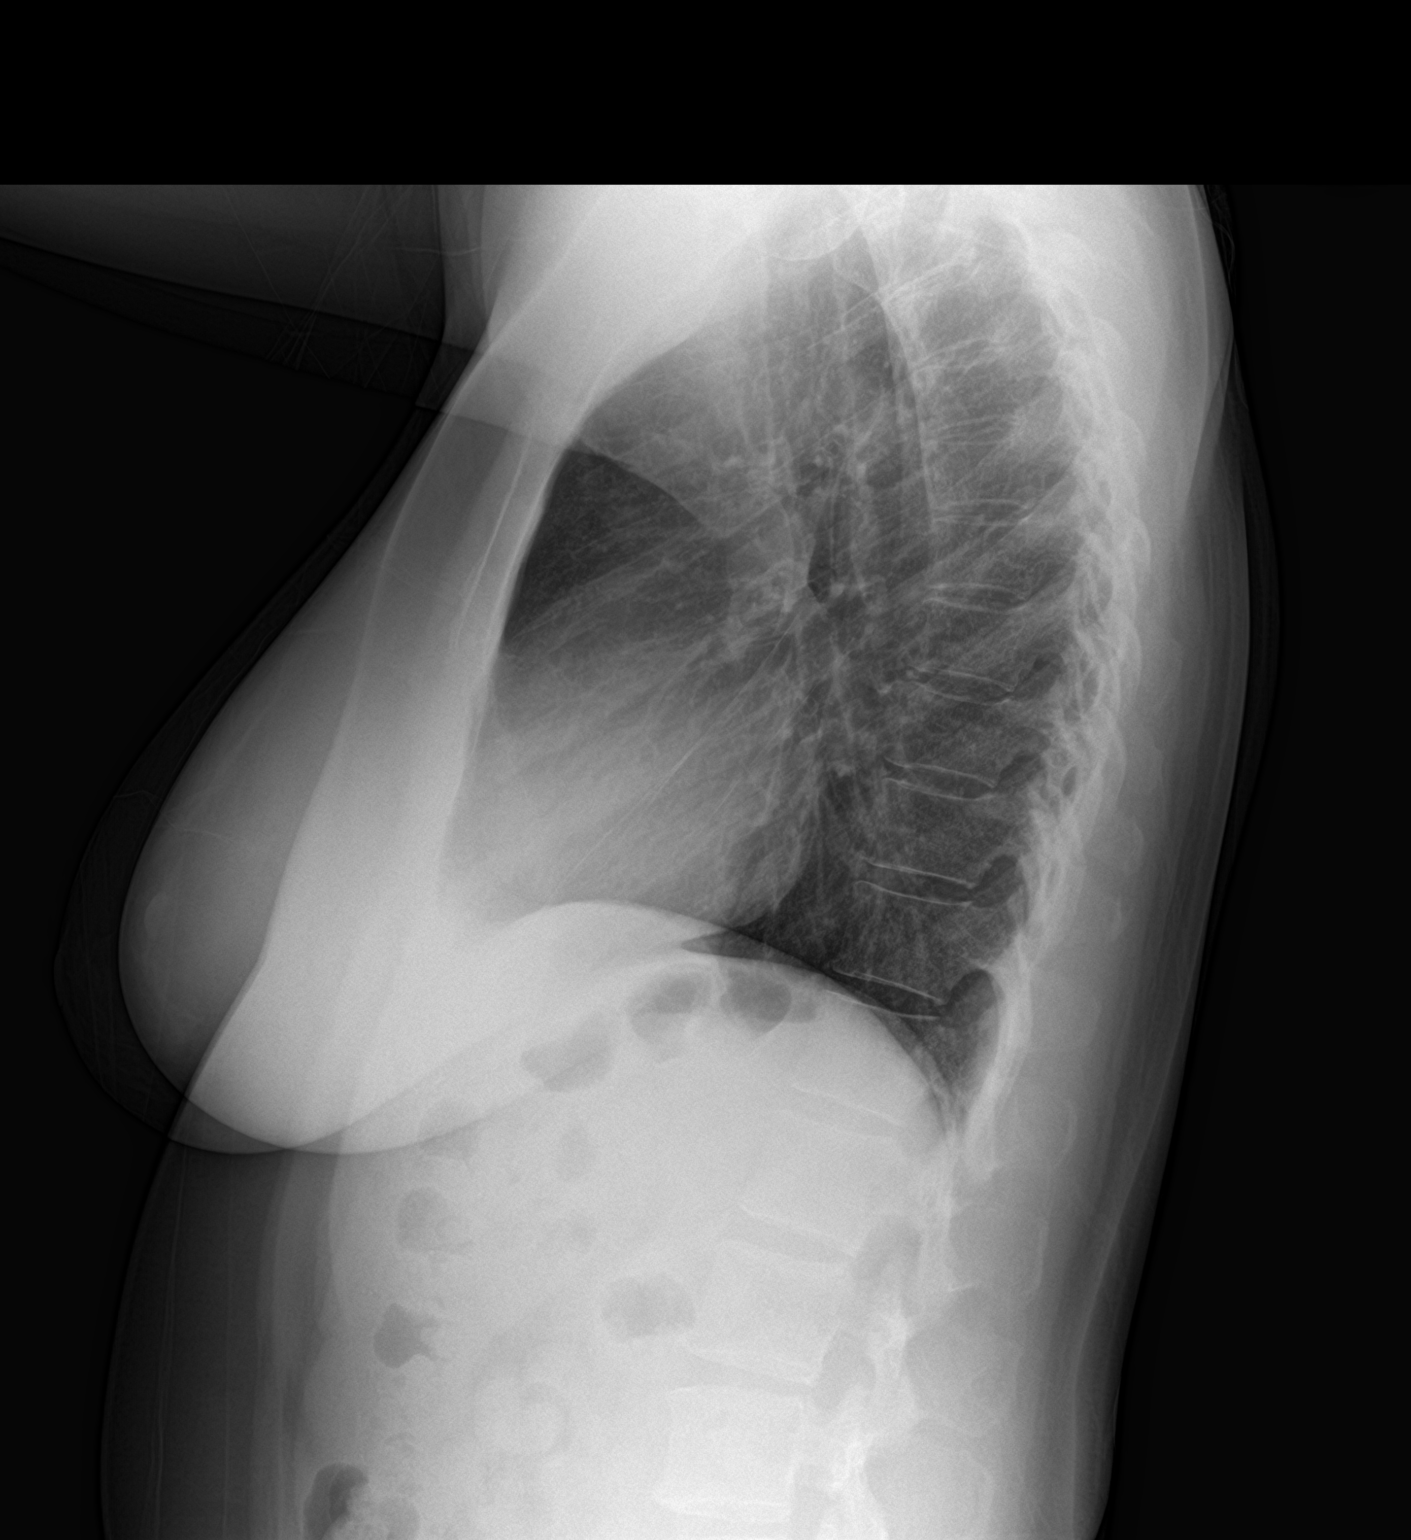

[2 of 2 positions shown; findings below may reference images not displayed]

FINDINGS: The heart size and mediastinal contours are within normal limits.
Both lungs are clear. The visualized skeletal structures are
unremarkable.
IMPRESSION: No active cardiopulmonary disease.

## 2017-03-21 NOTE — Telephone Encounter (Signed)
Pt came into the office today c/o 1 out of 10 chest pain and on and off left arm pain since Saturday. She is asymptomatic when sleeping. Denies nausea, jaw pain, diaphoresis. States Dr. Mariah MillingGollan told her to come by the office if she every had chest pain. Dr. Mariah MillingGollan is not in the office; no providers available. First available appt Jan 2. She would like to be evaluated today but is hesitant to go to the ER due to perceived wait time. Reviewed with Eula Listenyan Dunn, PA-C who advises pt to proceed to ER. I accompanied pt to the registration desk in the ER.

## 2017-03-21 NOTE — Telephone Encounter (Signed)
Pt c/o of Chest Pain: STAT if CP now or developed within 24 hours  1. Are you having CP right now? No   2. Are you experiencing any other symptoms (ex. SOB, nausea, vomiting, sweating)? No   3. How long have you been experiencing CP? Since Saturday   4. Is your CP continuous or coming and going? intermittent    5. Have you taken Nitroglycerin? No   Patient in th elobby wanting to see Dr. Mariah MillingGollan or maybe have an ekg  ?

## 2017-03-21 NOTE — Discharge Instructions (Signed)

## 2017-03-21 NOTE — Telephone Encounter (Signed)
Patient started episode having gi symptoms   Also c.o Left Arm weakness

## 2017-03-21 NOTE — ED Triage Notes (Signed)
Pt with chest pain on and off since Saturday.

## 2017-03-21 NOTE — ED Provider Notes (Signed)
[facility] Emergency Department Provider Note   ____________________________________________   First MD Initiated Contact with Patient 03/21/17 1734     (approximate)  I have reviewed the triage vital signs and the nursing notes.   HISTORY  Chief Complaint Chest Pain    HPI Monica Colon is a 56 y.o. female previous history of hypertension, hyperlipidemia and prior pericarditis.  The patient reports for about 2 weeks she has been experiencing intermittent intermittent discomfort in the left side of the chest, seem to be slightly worse over the last 3 days.  It comes and goes, does not seem to be associated with anything other than a slight reflux sensation and she is attributed to some of her IBS in the past.  Denies fevers or chills.  No nausea or vomiting.  No shortness of breath.  No leg swelling.  Currently reports pain-free, did take 325 mg of aspirin at home earlier today.  Nothing provokes it, does not worsen with exertion or exercising.  Denies history of coronary disease, but does have a history of some palpitations in the past as well as high blood pressure and elevated cholesterol.  Past Medical History:  Diagnosis Date  . Heart murmur    at age 56  . Hyperlipidemia   . Hypertension     Patient Active Problem List   Diagnosis Date Noted  . Acute pericarditis 04/19/2014  . Elevated sedimentation rate 04/19/2014  . Chest pain 12/13/2012  . Palpitations 01/15/2011  . Left arm pain 01/15/2011  . Hyperlipidemia 01/15/2011  . HTN (hypertension) 01/15/2011    Past Surgical History:  Procedure Laterality Date  . TONSILLECTOMY      Prior to Admission medications   Medication Sig Start Date End Date Taking? Authorizing Provider  ascorbic acid (C 1000) 1000 MG tablet Take 1,000 mg by mouth daily.    Yes [provider]  aspirin 81 MG tablet Take 81 mg by mouth daily.   Yes [provider]  Biotin (SUPER BIOTIN) 5  MG TABS Take 1 tablet by mouth daily.   Yes [provider]  [facility] Hospital[facility] Liver Oil 5000-500 UNIT/5ML OIL Take 1 tablet by mouth daily.   Yes [provider]  Coral Calcium-Magnesium-Vit D 200-100-100 MG-MG-UNIT CAPS Take 2 tablets by mouth daily.     Yes [provider]  ezetimibe-simvastatin (VYTORIN) 10-10 MG per tablet Take 0.5 tablets by mouth at bedtime.   Yes [provider]  famotidine (PEPCID) 40 MG tablet Take 1 tablet (40 mg total) by mouth daily. 09/10/16  Yes Gollan, Tollie Pizzaimothy J, MD  fish oil-omega-3 fatty acids 1000 MG capsule Take 2 g by mouth daily.     Yes [provider]  hydrochlorothiazide (MICROZIDE) 12.5 MG capsule take 1 capsule by mouth once daily 05/07/16  Yes Gollan, Tollie Pizzaimothy J, MD  losartan (COZAAR) 50 MG tablet Take 1 tablet (50 mg total) by mouth daily. 09/10/16  Yes Antonieta IbaGollan, Timothy J, MD  Multiple Vitamin (MULTIVITAMIN) capsule Take 1 capsule by mouth daily.     Yes [provider]  omeprazole (PRILOSEC) 40 MG capsule Take 40 mg by mouth daily.   Yes [provider]  Probiotic Product (ALIGN) 4 MG CAPS Take 1 tablet by mouth daily.     Yes [provider]  propranolol (INDERAL) 10 MG tablet Take 1 tablet (10 mg total) by mouth 3 (three) times daily as needed. 10/27/11 09/10/16  Antonieta IbaGollan, Timothy J, MD    Allergies Penicillins  Family History  Problem Relation Age of Onset  . Hyperlipidemia Mother   . Hypertension Mother   . Breast cancer Paternal Aunt     Social History Social History   Tobacco Use  . Smoking status: Never Smoker  . Smokeless tobacco: Never Used  Substance Use Topics  . Alcohol use: Yes    Alcohol/week: 0.6 oz    Types: 1 Glasses of wine per week    Comment: social drinker.  . Drug use: No    Review of Systems Constitutional: No fever/chills Eyes: No visual changes. ENT: No sore throat. Cardiovascular: See HPI Respiratory: Denies shortness of breath. Gastrointestinal: No  abdominal pain.  No nausea, no vomiting.  No diarrhea.  No constipation. Genitourinary: Negative for dysuria. Musculoskeletal: Negative for back pain. Skin: Negative for rash. Neurological: Negative for headaches, focal weakness or numbness.  Currently pain and symptom-free   ____________________________________________   PHYSICAL EXAM:  VITAL SIGNS: ED Triage Vitals  Enc Vitals Group     BP 03/21/17 1634 (!) 167/88     Pulse Rate 03/21/17 1634 73     Resp 03/21/17 1634 20     Temp 03/21/17 1634 97.7 F (36.5 C)     Temp Source 03/21/17 1634 Oral     SpO2 03/21/17 1634 99 %     Weight 03/21/17 1632 150 lb (68 kg)     Height --      Head Circumference --      Peak Flow --      Pain Score 03/21/17 1631 1     Pain Loc --      Pain Edu? --      Excl. in GC? --     Constitutional: Alert and oriented. Well appearing and in no acute distress.  Very pleasant and in no distress. Eyes: Conjunctivae are normal. Head: Atraumatic. Nose: No congestion/rhinnorhea. Mouth/Throat: Mucous membranes are moist. Neck: No stridor.   Cardiovascular: Normal rate, regular rhythm. Grossly normal heart sounds.  Good peripheral circulation.  Currently reports no symptoms at this time. Respiratory: Normal respiratory effort.  No retractions. Lungs CTAB. Gastrointestinal: Soft and nontender. No distention. Musculoskeletal: No lower extremity tenderness nor edema. Neurologic:  Normal speech and language. No gross focal neurologic deficits are appreciated.  Skin:  Skin is warm, dry and intact. No rash noted. Psychiatric: Mood and affect are normal. Speech and behavior are normal.  ____________________________________________   LABS (all labs ordered are listed, but only abnormal results are displayed)  Labs Reviewed  BASIC METABOLIC PANEL - Abnormal; Notable for the following components:      Result Value   Chloride 99 (*)    CO2 33 (*)    Glucose, Bld 112 (*)    All other components within  normal limits  CBC  TROPONIN I  TROPONIN I  POC URINE PREG, ED   ____________________________________________  EKG  Reviewed and interpreted by me and discussed with Dr. Kirke Corin  EKG time 1640 Heart rate 70 QRS 70 QTC 440 Normal sinus rhythm, some minimal ST abnormality and very minimal depressions in aVF, slight biphasic appearance of the T wave in V6 as well as V2.  Compared with previous EKG from September 10, 2016, does not appear to show any significant change ____________________________________________  RADIOLOGY  Dg Chest 2 View  Result Date: 03/21/2017 CLINICAL DATA:  Chest pain EXAM: CHEST  2 VIEW COMPARISON:  11/25/2012 FINDINGS: The heart size and mediastinal contours are within normal limits. Both lungs are clear. The visualized  skeletal structures are unremarkable. IMPRESSION: No active cardiopulmonary disease. Electronically Signed   By: Marlan Palauharles  Clark M.D.   On: 03/21/2017 17:14    Chest x-ray reviewed, negative for acute ____________________________________________   PROCEDURES  Procedure(s) performed: None  Procedures  Critical Care performed: No  ____________________________________________   INITIAL IMPRESSION / ASSESSMENT AND PLAN / ED COURSE  Pertinent labs & imaging results that were available during my care of the patient were reviewed by me and considered in my medical decision making (see chart for details).  Differential diagnosis includes, but is not limited to, ACS, aortic dissection, pulmonary embolism, cardiac tamponade, pneumothorax, pneumonia, pericarditis, myocarditis, GI-related causes including esophagitis/gastritis, and musculoskeletal chest wall pain.    Normal oxygen saturation, no pleuritic discomfort or pain.  No provoking factors for pulmonary embolism, no ripping tearing or moving discomfort to suggest PE.  Atypical symptomatology, ongoing off and on for a couple of weeks seemingly worse the last few days.  No associated abdominal  symptoms, reassuring clinical examination currently pain-free.  Her heart score is 3 points, still low risk for ACS.  First troponin negative.   OBSERVATION CARE: 1850hrs This patient is being placed under observation care for the following reasons: Chest pain with repeat testing to rule out ischemia  Patient placed in observation, currently pain-free.  Plan to second troponin    Clinical Course as of Mar 21 2040  Mon Mar 21, 2017  1850 Patient's case and care including EKG, clinical history, past medical history reviewed with Dr. Romilda JoyMohamed Arida.  He advises that if the patient's second troponin at 3 hours is negative and she remains pain-free she may be discharged and cardiology will call her to set up very close follow-up.  Discussed this with the patient, she is in agreement with the plan and agreeable with careful return precautions and close follow-up with cardiology.  On reexam at this time she reports no pain ongoing, currently asymptomatic  [MQ]    Clinical Course User Index [MQ] Sharyn CreamerQuale, Mark, MD   ----------------------------------------- 8:40 PM on 03/21/2017 -----------------------------------------  Second troponin negative, patient resting comfortably.  No ongoing chest discomfort or pain.  ----------------------------------------- 8:40 PM on 03/21/2017 -----------------------------------------   END OF OBSERVATION STATUS: After an appropriate period of observation, this patient is being discharged due to the following reason(s): No ongoing discomfort, plan in place for close follow-up, discussed with her cardiologist and second troponin is normal   ____________________________________________   FINAL CLINICAL IMPRESSION(S) / ED DIAGNOSES  Final diagnoses:  Chest pain with low risk of acute coronary syndrome      NEW MEDICATIONS STARTED DURING THIS VISIT:  This SmartLink is deprecated. Use AVSMEDLIST instead to display the medication list for a  patient.   Note:  This document was prepared using Dragon voice recognition software and may include unintentional dictation errors.     Sharyn CreamerQuale, Mark, MD 03/21/17 2041

## 2017-03-24 ENCOUNTER — Telehealth: Payer: Self-pay

## 2017-03-24 ENCOUNTER — Other Ambulatory Visit: Payer: Self-pay

## 2017-03-24 DIAGNOSIS — R079 Chest pain, unspecified: Secondary | ICD-10-CM

## 2017-03-24 NOTE — Telephone Encounter (Signed)
Iran OuchArida, Muhammad A, MD  Shon BatonYow, Omar Gayden H, RN  Cc: Antonieta IbaGollan, Timothy J, MD        This is a patient of Dr. Mariah MillingGollan seen this week in the emergency department for chest pain and nonspecific abnormal EKG.  Schedule her for a treadmill Myoview and follow-up with Dr. Mariah MillingGollan or APP after.    Order placed. Left message on machine for patient to contact the office.

## 2017-03-30 NOTE — Telephone Encounter (Signed)
S/w pt and scheduled treadmill myoview 12/28, arrival 8:45am. Reviewed pre-testing instructions. Pt verbalized understanding. Sent instructions to her MyChart account. Pt agreeable with no further questions at this time.

## 2017-04-01 ENCOUNTER — Ambulatory Visit
Admission: RE | Admit: 2017-04-01 | Discharge: 2017-04-01 | Disposition: A | Discharge status: Home / Self Care | Payer: BC Managed Care – PPO | Source: Ambulatory Visit | Attending: Cardiovascular Disease | Admitting: Cardiovascular Disease

## 2017-04-01 DIAGNOSIS — R079 Chest pain, unspecified: Secondary | ICD-10-CM | POA: Diagnosis not present

## 2017-04-01 LAB — NM MYOCAR MULTI W/SPECT W/WALL MOTION / EF
CHL CUP NUCLEAR SSS: 8
CHL CUP RESTING HR STRESS: 64 {beats}/min
CHL CUP STRESS STAGE 2 GRADE: 0.1 %
CHL CUP STRESS STAGE 2 HR: 61 {beats}/min
CHL CUP STRESS STAGE 2 SPEED: 0 mph
CHL CUP STRESS STAGE 3 GRADE: 10 %
CHL CUP STRESS STAGE 3 HR: 133 {beats}/min
CHL CUP STRESS STAGE 4 DBP: 144 mmHg
CHL CUP STRESS STAGE 5 GRADE: 0 %
CHL CUP STRESS STAGE 5 SPEED: 0 mph
CHL CUP STRESS STAGE 6 DBP: 110 mmHg
CHL CUP STRESS STAGE 6 GRADE: 0 %
CHL CUP STRESS STAGE 6 HR: 79 {beats}/min
CHL CUP STRESS STAGE 6 SBP: 151 mmHg
CSEPEDS: 18 s
CSEPEW: 7 METS
CSEPHR: 92 %
CSEPPMHR: 92 %
Exercise duration (min): 5 min
LV dias vol: 29 mL (ref 46–106)
LVSYSVOL: 7 mL
Peak BP: 168 mmHg
Peak HR: 151 {beats}/min
SDS: 7
SRS: 1
Stage 1 Grade: 0 %
Stage 1 HR: 60 {beats}/min
Stage 1 Speed: 0 mph
Stage 3 Speed: 1.7 mph
Stage 4 Grade: 12 %
Stage 4 HR: 151 {beats}/min
Stage 4 SBP: 168 mmHg
Stage 4 Speed: 2.5 mph
Stage 5 HR: 120 {beats}/min
Stage 6 Speed: 0 mph
TID: 0.76

## 2017-04-01 MED ORDER — TECHNETIUM TC 99M TETROFOSMIN IV KIT
10.0000 | PACK | Freq: Once | INTRAVENOUS | Status: AC | PRN
  Administered 2017-04-01: 12.6 via INTRAVENOUS

## 2017-04-01 MED ORDER — TECHNETIUM TC 99M TETROFOSMIN IV KIT
32.5600 | PACK | Freq: Once | INTRAVENOUS | Status: AC | PRN
  Administered 2017-04-01: 32.56 via INTRAVENOUS

## 2017-04-07 ENCOUNTER — Ambulatory Visit: Payer: BC Managed Care – PPO | Admitting: Cardiovascular Disease

## 2017-04-07 ENCOUNTER — Encounter: Payer: Self-pay | Admitting: Cardiovascular Disease

## 2017-04-07 VITALS — BP 120/80 | HR 59 | Ht 65.0 in | Wt 149.5 lb

## 2017-04-07 DIAGNOSIS — E782 Mixed hyperlipidemia: Secondary | ICD-10-CM | POA: Diagnosis not present

## 2017-04-07 DIAGNOSIS — R002 Palpitations: Secondary | ICD-10-CM | POA: Diagnosis not present

## 2017-04-07 DIAGNOSIS — M79602 Pain in left arm: Secondary | ICD-10-CM

## 2017-04-07 DIAGNOSIS — R079 Chest pain, unspecified: Secondary | ICD-10-CM | POA: Diagnosis not present

## 2017-04-07 DIAGNOSIS — I10 Essential (primary) hypertension: Secondary | ICD-10-CM | POA: Diagnosis not present

## 2017-04-07 NOTE — Progress Notes (Signed)
Cardiology Office Note  Date:  04/07/2017   ID:  LAURABELLE GORCZYCA, DOB 1960/05/30, MRN [id number]  PCP:  Clarisse Gouge, MD   Chief Complaint  Patient presents with  . Other    ED follow up for chest pain. Patient states she no longer has chest pain. Meds reviewed verbally with patient.     HPI:  57 year old woman with a history of  palpitations and fluttering in her chest back in 2009  hypertension,  hyperlipidemia,  previously seen for left arm pain.  Diagnosed with pericarditis in 2014 with abnormal EKG, diffuse T-wave abnormality, treated with colchicine and NSAIDs. presents today for follow-up of her palpitations and chest pressure/pain  Reported having recent chest and left arm discomfort wall sitting at her desk Symptoms persisted Not associated with exertion She called our office and requested evaluation Stress test was ordered to rule out ischemia Results reviewed with her in detail showing ejection fraction, no ischemia  Lab work reviewed  Total chol 163, LDL 87  Blood pressure stable taking losartan and HCTZ daily previously was taking HCTZ every other day)  She takes propranolol as needed for palpitations.   EKG on today's visit shows normal sinus rhythm with rate 59 bpm, no significant ST or T-wave changes  Other past medical history reviewed Previously hadchest pain symptoms radiating up in her clavicle and neck area.  went to [facility]. Workup in the emergency room with negative cardiac enzymes, negative chest x-ray. She was given GI cocktail.   Previously seen in the emergency room at [facility]. Potassium was 3.1. She had a second visit to [facility] Over the course of her 2 visits, she had significant testing including stress echo that showed no wall motion abnormality, chest x-rays, chest CT scan showing left greater than right pleural effusion with small pericardial effusion, atelectasis the left, neck CTA which was essentially normal. Notes indicate she  was instructed to take anti-inflammatories  Initial EKG at [facility] August 20 13,014 showed no significant ST-T wave changes Potassium at [facility] was 3.3  Since her discharge from [facility], she has continued to have low-grade discomfort, improved with NSAIDs such as Advil.  Other blood work of note that at [facility] included elevated ESR and CRP. CRP was 16, sedimentation rate 100. Normal cardiac enzymes   PMH:   has a past medical history of Heart murmur, Hyperlipidemia, and Hypertension.  PSH:    Past Surgical History:  Procedure Laterality Date  . TONSILLECTOMY      Current Outpatient Medications  Medication Sig Dispense Refill  . ascorbic acid (C 1000) 1000 MG tablet Take 1,000 mg by mouth daily.     Marland Kitchen aspirin 81 MG tablet Take 81 mg by mouth daily.    . Biotin (SUPER BIOTIN) 5 MG TABS Take 1 tablet by mouth daily.    . Cod Liver Oil 5000-500 UNIT/5ML OIL Take 1 tablet by mouth daily.    Marland Kitchen Coral Calcium-Magnesium-Vit D 200-100-100 MG-MG-UNIT CAPS Take 2 tablets by mouth daily.      Marland Kitchen ezetimibe-simvastatin (VYTORIN) 10-10 MG per tablet Take 0.5 tablets by mouth at bedtime.    . famotidine (PEPCID) 40 MG tablet Take 1 tablet (40 mg total) by mouth daily.    . fish oil-omega-3 fatty acids 1000 MG capsule Take 2 g by mouth daily.      . hydrochlorothiazide (MICROZIDE) 12.5 MG capsule take 1 capsule by mouth once daily 90 capsule 3  . losartan (COZAAR) 50 MG tablet Take  1 tablet (50 mg total) by mouth daily.    . Multiple Vitamin (MULTIVITAMIN) capsule Take 1 capsule by mouth daily.      Marland Kitchen omeprazole (PRILOSEC) 40 MG capsule Take 40 mg by mouth daily.    . Probiotic Product (ALIGN) 4 MG CAPS Take 1 tablet by mouth daily.      . propranolol (INDERAL) 10 MG tablet Take 1 tablet (10 mg total) by mouth 3 (three) times daily as needed. 90 tablet 6   No current facility-administered medications for this visit.      Allergies:   Penicillins   Social History:  The patient   reports that  has never smoked. she has never used smokeless tobacco. She reports that she drinks about 0.6 oz of alcohol per week. She reports that she does not use drugs.   Family History:   family history includes Breast cancer in her paternal aunt; Hyperlipidemia in her mother; Hypertension in her mother.    Review of Systems: Review of Systems  Constitutional: Negative.   Respiratory: Negative.   Cardiovascular: Positive for chest pain.       Left arm tingling  Gastrointestinal: Negative.   Musculoskeletal: Negative.   Neurological: Negative.   Psychiatric/Behavioral: Negative.   All other systems reviewed and are negative.    PHYSICAL EXAM: VS:  BP 120/80 (BP Location: Left Arm, Patient Position: Sitting, Cuff Size: Normal)   Pulse (!) 59   Ht 5' 5"  (1.651 m)   Wt 149 lb 8 oz (67.8 kg)   BMI 24.88 kg/m  , BMI Body mass index is 24.88 kg/m.  GEN: Well nourished, well developed, in no acute distress  HEENT: normal  Neck: no JVD, carotid bruits, or masses Cardiac: RRR; no murmurs, rubs, or gallops,no edema  Respiratory:  clear to auscultation bilaterally, normal work of breathing GI: soft, nontender, nondistended, + BS MS: no deformity or atrophy  Skin: warm and dry, no rash Neuro:  Strength and sensation are intact Psych: euthymic mood, full affect    Recent Labs: 03/21/2017: BUN 16; Creatinine, Ser 0.90; Hemoglobin 14.3; Platelets 358; Potassium 3.6; Sodium 140    Lipid Panel No results found for: CHOL, HDL, LDLCALC, TRIG    Wt Readings from Last 3 Encounters:  04/07/17 149 lb 8 oz (67.8 kg)  03/21/17 150 lb (68 kg)  09/10/16 150 lb 8 oz (68.3 kg)       ASSESSMENT AND PLAN:  Chest pain, unspecified type - Plan: EKG 12-Lead Atypical symptoms,  Recent negative stress test with no ischemia   no further workup at this time  Essential hypertension - Plan: EKG 12-Lead Blood pressure is well controlled on today's visit. No changes made to the  medications.  Mixed hyperlipidemia - Plan: EKG 12-Lead Cholesterol is at goal on the current lipid regimen. No changes to the medications were made.  stable  Palpitations - Plan: EKG 12-Lead  propranolol as needed Stable   Disposition:   F/U  12 months prn   Total encounter time more than 25 minutes  Greater than 50% was spent in counseling and coordination of care with the patient    Orders Placed This Encounter  Procedures  . EKG 12-Lead     Signed, Esmond Plants, M.D., Ph.D. 04/07/2017  [place], [place]

## 2017-04-07 NOTE — Patient Instructions (Signed)

## 2017-12-21 ENCOUNTER — Other Ambulatory Visit: Payer: Self-pay | Admitting: Family Medicine

## 2017-12-21 DIAGNOSIS — Z1231 Encounter for screening mammogram for malignant neoplasm of breast: Secondary | ICD-10-CM

## 2018-01-20 ENCOUNTER — Ambulatory Visit
Admission: RE | Admit: 2018-01-20 | Discharge: 2018-01-20 | Disposition: A | Discharge status: Home / Self Care | Payer: BC Managed Care – PPO | Source: Ambulatory Visit | Attending: Family Medicine | Admitting: Family Medicine

## 2018-01-20 DIAGNOSIS — Z1231 Encounter for screening mammogram for malignant neoplasm of breast: Secondary | ICD-10-CM

## 2018-01-20 IMAGING — MG DIGITAL SCREENING BILATERAL MAMMOGRAM WITH TOMO AND CAD
6 of 10 series · 6 of 30 positions shown · non-contrast
Comparison: Previous exam(s).

CLINICAL DATA: Screening.

EXAM:
DIGITAL SCREENING BILATERAL MAMMOGRAM WITH TOMO AND CAD

[L MLO synth-2D]
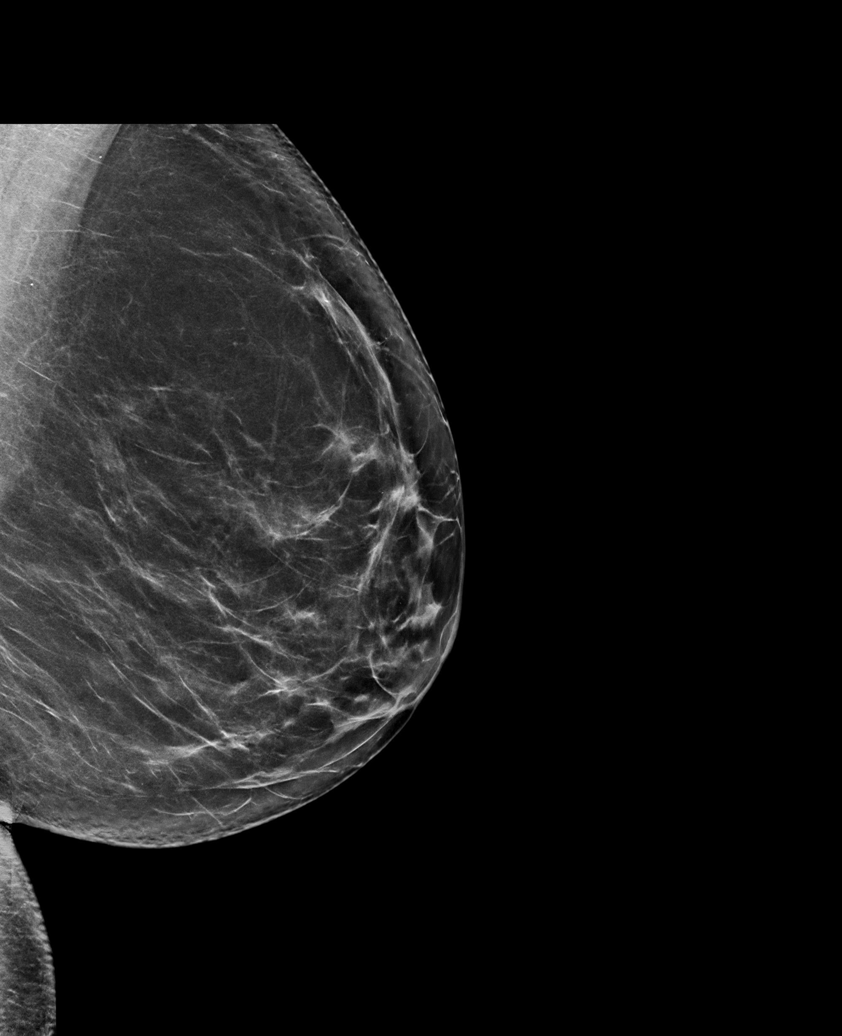

[R CC synth-2D (1 of 2)]
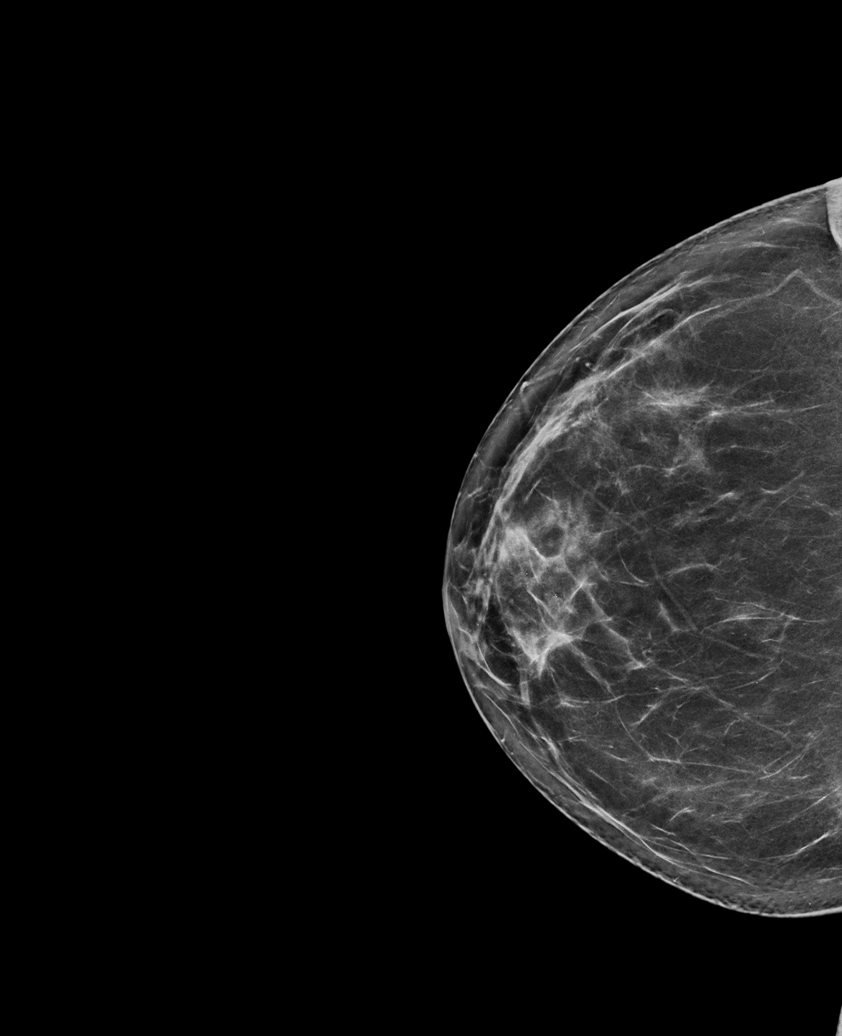

[R MLO synth-2D]
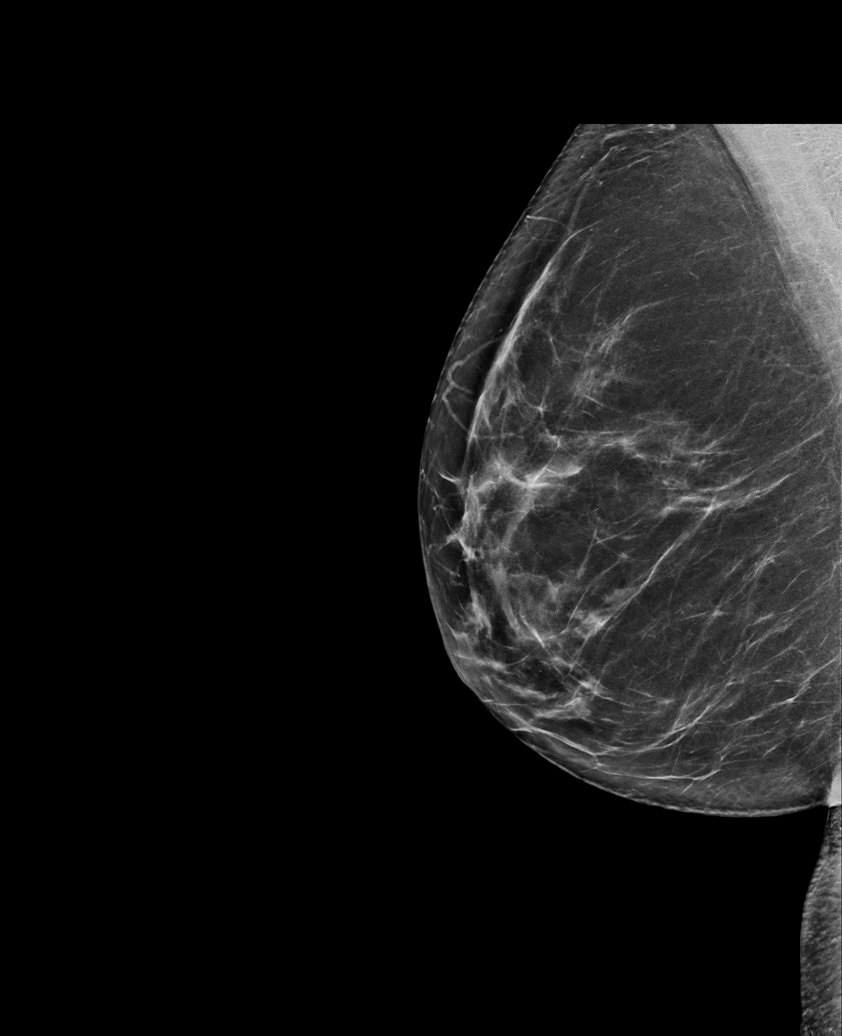

[L CC synth-2D]
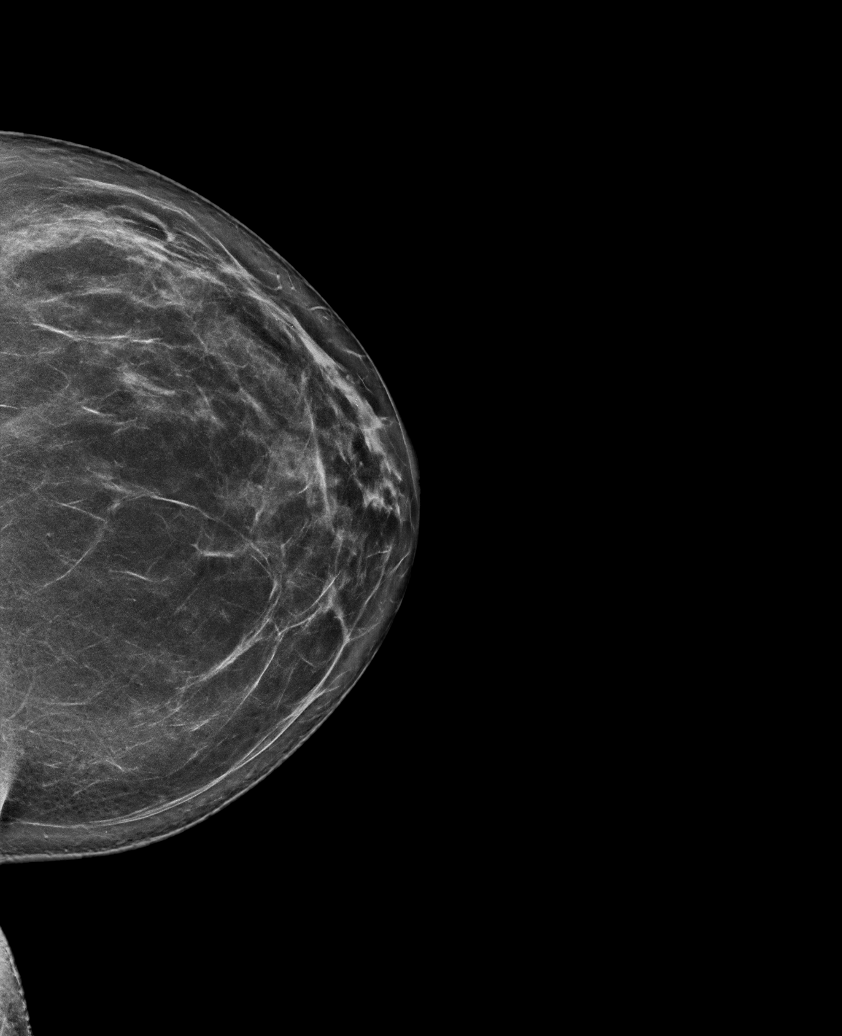

[R CC synth-2D (2 of 2)]
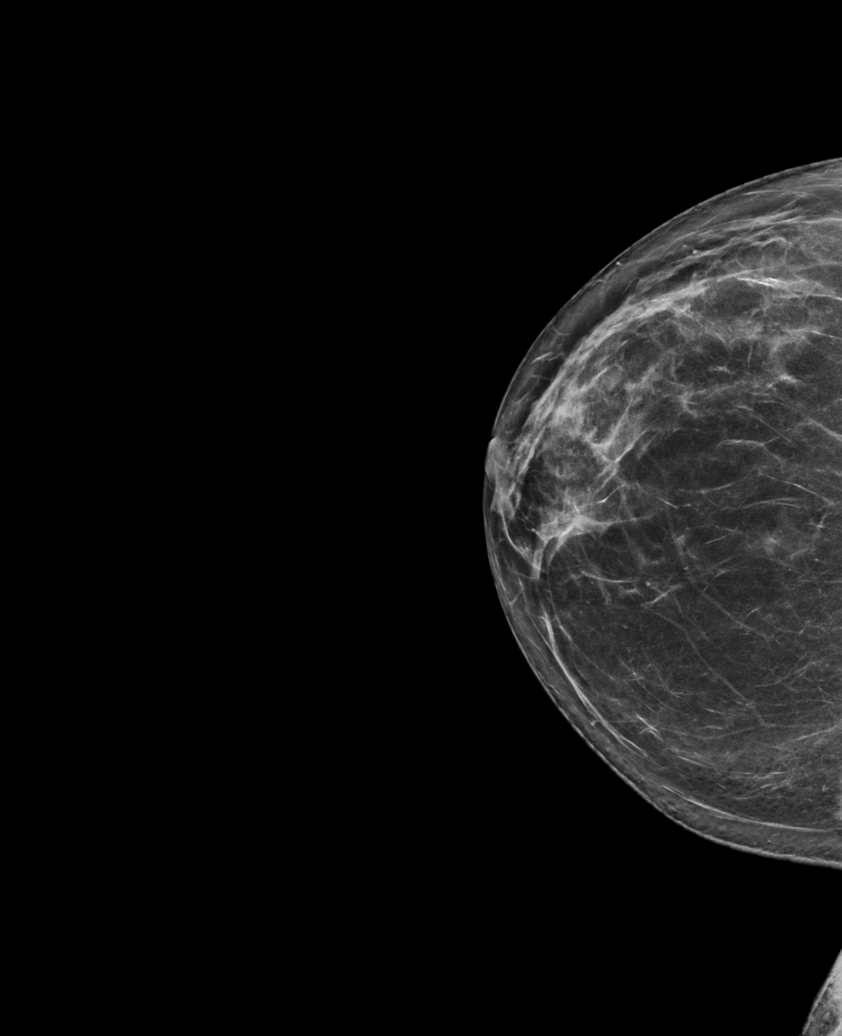

[R CC tomo · tomo slice 39/76.0]
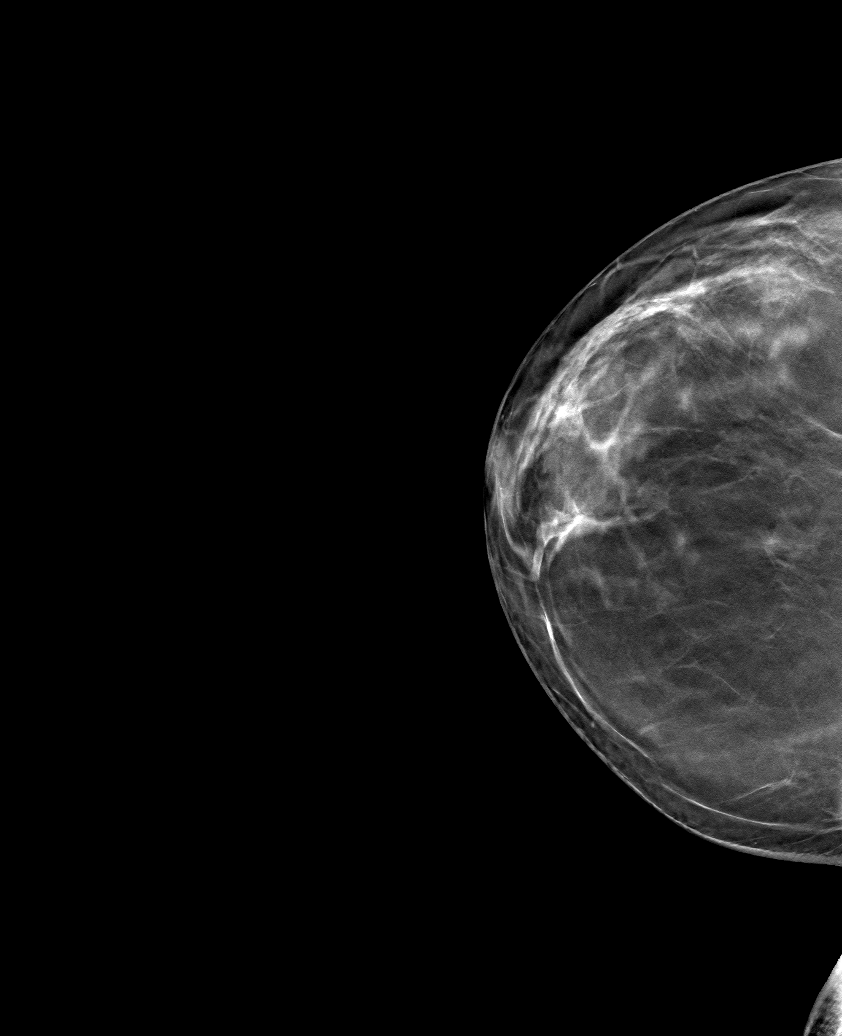

[6 of 30 positions shown; findings below may reference images not displayed]

ACR Breast Density Category b: There are scattered areas of
fibroglandular density.
FINDINGS: There are no findings suspicious for malignancy. Images were
processed with CAD.
IMPRESSION: No mammographic evidence of malignancy. A result letter of this
screening mammogram will be mailed directly to the patient.

RECOMMENDATION:
Screening mammogram in one year. (Code:[TQ])

BI-RADS CATEGORY  1: Negative.

## 2018-07-03 ENCOUNTER — Telehealth: Payer: Self-pay

## 2018-07-03 NOTE — Telephone Encounter (Signed)
Left voicemail message requesting to have patient call back to discuss changing current appointment to an e-visit.

## 2018-07-06 NOTE — Telephone Encounter (Signed)
LMOV for patient to call back.  Need to change to telephone or video visit.

## 2018-07-18 ENCOUNTER — Ambulatory Visit: Payer: BC Managed Care – PPO | Admitting: Cardiovascular Disease

## 2018-08-24 NOTE — Telephone Encounter (Signed)
Called patient.  No answer. LMOV.  Need to change appointment to Evisit.   

## 2018-09-04 NOTE — Progress Notes (Signed)
Virtual Visit via Video Note   This visit type was conducted due to national recommendations for restrictions regarding the COVID-19 Pandemic (e.g. social distancing) in an effort to limit this patient's exposure and mitigate transmission in our community.  Due to her co-morbid illnesses, this patient is at least at moderate risk for complications without adequate follow up.  This format is felt to be most appropriate for this patient at this time.  All issues noted in this document were discussed and addressed.  A limited physical exam was performed with this format.  Please refer to the patient's chart for her consent to telehealth for [facility].   I connected with  Monica Colon on 09/05/18 by a video enabled telemedicine application and verified that I am speaking with the correct person using two identifiers. I discussed the limitations of evaluation and management by telemedicine. The patient expressed understanding and agreed to proceed.   Evaluation Performed:  Follow-up visit  Date:  09/05/2018   ID:  Monica Colon, DOB Jun 19, 1960, MRN [id number]  Patient Location:  [address] [place] [address]   Provider location:   [facility], [place] office  PCP:  Clarisse Gouge, MD  Cardiologist:  Patsy Baltimore   Chief Complaint:  "Felt off " in Jan-feb 2020   History of Present Illness:    Monica Colon is a 58 y.o. female who presents via audio/video conferencing for a telehealth visit today.   The patient does not symptoms concerning for COVID-19 infection (fever, chills, cough, or new SHORTNESS OF BREATH).   Patient has a past medical history of palpitations and fluttering in her chest back in 2009  hypertension,  hyperlipidemia,  previously seen for left arm pain.  Diagnosed with pericarditis in 2014 with abnormal EKG, diffuse T-wave abnormality, treated with colchicine and NSAIDs. presents today for follow-up of her  palpitations and chest pressure/pain  Jan/Feb 2020 Maybe had irregular heart beat?, Felt weak, "not herself" Took potassium,  Last years lab work reviewed Total chol 147, LDL 75  CT neck and chest:  No atherosclerosis, CAD  She takes propranolol as needed for palpitations.  No further chest and arm pain  On prior office visit she reported having chest and left arm discomfort wall sitting at her desk Stress test  showing ejection fraction, no ischemia  Other past medical history reviewed Previously had chest pain symptoms radiating up in her clavicle and neck area. went to [facility]. Workup in the emergency room with negative cardiac enzymes, negative chest x-ray. She was given GI cocktail.  Previously seen in the emergency room at [facility]. Potassium was 3.1. She had a second visit to [facility]. Over the course of her 2 visits, she had significant testing including stress echo that showed no wall motion abnormality, chest x-rays, chest CT scan showing left greater than right pleural effusion with small pericardial effusion, atelectasis the left, neck CTA which was essentially normal. Notes indicate she was instructed to take anti-inflammatories  Initial EKG at [facility] August 20 13,014 showed no significant ST-T wave changes Potassium at [facility] was 3.3  Other blood work of note  at [place] included elevated ESR and CRP. CRP was 16, sedimentation rate 100. Normal cardiac enzymes   Prior CV studies:   The following studies were reviewed today:    Past Medical History:  Diagnosis Date  . Heart murmur    at age 9  . Hyperlipidemia   .  Hypertension    Past Surgical History:  Procedure Laterality Date  . TONSILLECTOMY       No outpatient medications have been marked as taking for the 09/05/18 encounter (Appointment) with Minna Merritts, MD.     Allergies:   Penicillins   Social History   Tobacco Use  . Smoking status: Never Smoker  . Smokeless tobacco: Never Used   Substance Use Topics  . Alcohol use: Yes    Alcohol/week: 1.0 standard drinks    Types: 1 Glasses of wine per week    Comment: social drinker.  . Drug use: No     Current Outpatient Medications on File Prior to Visit  Medication Sig Dispense Refill  . ascorbic acid (C 1000) 1000 MG tablet Take 1,000 mg by mouth daily.     Marland Kitchen aspirin 81 MG tablet Take 81 mg by mouth daily.    . Biotin (SUPER BIOTIN) 5 MG TABS Take 1 tablet by mouth daily.    . Cod Liver Oil 5000-500 UNIT/5ML OIL Take 1 tablet by mouth daily.    Marland Kitchen Coral Calcium-Magnesium-Vit D 200-100-100 MG-MG-UNIT CAPS Take 2 tablets by mouth daily.      Marland Kitchen ezetimibe-simvastatin (VYTORIN) 10-10 MG per tablet Take 0.5 tablets by mouth at bedtime.    . famotidine (PEPCID) 40 MG tablet Take 1 tablet (40 mg total) by mouth daily.    . fish oil-omega-3 fatty acids 1000 MG capsule Take 2 g by mouth daily.      . hydrochlorothiazide (MICROZIDE) 12.5 MG capsule take 1 capsule by mouth once daily 90 capsule 3  . losartan (COZAAR) 50 MG tablet Take 1 tablet (50 mg total) by mouth daily.    . Multiple Vitamin (MULTIVITAMIN) capsule Take 1 capsule by mouth daily.      Marland Kitchen omeprazole (PRILOSEC) 40 MG capsule Take 40 mg by mouth daily.    . Probiotic Product (ALIGN) 4 MG CAPS Take 1 tablet by mouth daily.      . propranolol (INDERAL) 10 MG tablet Take 1 tablet (10 mg total) by mouth 3 (three) times daily as needed. 90 tablet 6   No current facility-administered medications on file prior to visit.      Family Hx: The patient's family history includes Breast cancer in her paternal aunt; Hyperlipidemia in her mother; Hypertension in her mother.  ROS:   Please see the history of present illness.    Review of Systems  Constitutional: Negative.   Respiratory: Negative.   Cardiovascular: Negative.   Gastrointestinal: Negative.   Musculoskeletal: Negative.   Neurological: Negative.   Psychiatric/Behavioral: Negative.   All other systems reviewed and  are negative.     Labs/Other Tests and Data Reviewed:    Recent Labs: No results found for requested labs within last 8760 hours.   Recent Lipid Panel No results found for: CHOL, TRIG, HDL, CHOLHDL, LDLCALC, LDLDIRECT  Wt Readings from Last 3 Encounters:  04/07/17 149 lb 8 oz (67.8 kg)  03/21/17 150 lb (68 kg)  09/10/16 150 lb 8 oz (68.3 kg)     Exam:    Vital Signs: Vital signs may also be detailed in the HPI There were no vitals taken for this visit.  Wt Readings from Last 3 Encounters:  04/07/17 149 lb 8 oz (67.8 kg)  03/21/17 150 lb (68 kg)  09/10/16 150 lb 8 oz (68.3 kg)   Temp Readings from Last 3 Encounters:  03/21/17 97.7 F (36.5 C) (Oral)   BP Readings from  Last 3 Encounters:  04/07/17 120/80  03/21/17 135/71  09/10/16 116/80   Pulse Readings from Last 3 Encounters:  04/07/17 (!) 59  03/21/17 64  09/10/16 (!) 56    Weight 148, resp 16 130/86, pulse 62  Well nourished, well developed female in no acute distress. Constitutional:  oriented to person, place, and time. No distress.    ASSESSMENT & PLAN:    Chest pain, unspecified type Not much sx, no further workup needed  Essential hypertension Blood pressure is well controlled on today's visit. No changes made to the medications.  Palpitations Not taking propranolol  Mixed hyperlipidemia Cholesterol is at goal on the current lipid regimen. No changes to the medications were made.   COVID-19 Education: The signs and symptoms of COVID-19 were discussed with the patient and how to seek care for testing (follow up with PCP or arrange E-visit).  The importance of social distancing was discussed today.  Patient Risk:   After full review of this patients clinical status, I feel that they are at least moderate risk at this time.  Time:   Today, I have spent 25 minutes with the patient with telehealth technology discussing the cardiac and medical problems/diagnoses detailed above   10 min spent  reviewing the chart prior to patient visit today   Medication Adjustments/Labs and Tests Ordered: Current medicines are reviewed at length with the patient today.  Concerns regarding medicines are outlined above.   Tests Ordered: No tests ordered   Medication Changes: No changes made   Disposition: Follow-up in 12 months   Signed, Ida Rogue, MD  09/05/2018 8:06 AM    [place] [facility] [address] [address], [place], [place] [address]

## 2018-09-05 ENCOUNTER — Other Ambulatory Visit: Payer: Self-pay

## 2018-09-05 ENCOUNTER — Telehealth (INDEPENDENT_AMBULATORY_CARE_PROVIDER_SITE_OTHER): Payer: BC Managed Care – PPO | Admitting: Cardiovascular Disease

## 2018-09-05 ENCOUNTER — Telehealth: Payer: Self-pay | Admitting: Cardiovascular Disease

## 2018-09-05 DIAGNOSIS — I10 Essential (primary) hypertension: Secondary | ICD-10-CM | POA: Diagnosis not present

## 2018-09-05 DIAGNOSIS — R079 Chest pain, unspecified: Secondary | ICD-10-CM

## 2018-09-05 DIAGNOSIS — R002 Palpitations: Secondary | ICD-10-CM | POA: Diagnosis not present

## 2018-09-05 DIAGNOSIS — E782 Mixed hyperlipidemia: Secondary | ICD-10-CM

## 2018-09-05 MED ORDER — LOSARTAN POTASSIUM 50 MG PO TABS: 50.0000 mg | ORAL_TABLET | Freq: Every day | ORAL | 3 refills | Status: DC

## 2018-09-05 MED ORDER — PROPRANOLOL HCL 10 MG PO TABS: 10.0000 mg | ORAL_TABLET | Freq: Three times a day (TID) | ORAL | 0 refills | Status: DC | PRN

## 2018-09-05 MED ORDER — HYDROCHLOROTHIAZIDE 12.5 MG PO CAPS: 12.5000 mg | ORAL_CAPSULE | Freq: Every day | ORAL | 3 refills | Status: DC

## 2018-09-05 NOTE — Telephone Encounter (Signed)
Spoke with patient and reviewed that we offer virtual visits and that it would help reduce her risk for exposure with current virus and hospital restrictions. Reviewed consent for virtual visit and she was willing to proceed with appointment. Confirmed number she would like provider to call and she had no further questions at this time.   YOUR CARDIOLOGY TEAM HAS ARRANGED FOR AN E-VISIT FOR YOUR APPOINTMENT - PLEASE REVIEW IMPORTANT INFORMATION BELOW SEVERAL DAYS PRIOR TO YOUR APPOINTMENT  Due to the recent COVID-19 pandemic, we are transitioning in-person office visits to tele-medicine visits in an effort to decrease unnecessary exposure to our patients, their families, and staff. These visits are billed to your insurance just like a normal visit is. We also encourage you to sign up for MyChart if you have not already done so. You will need a smartphone if possible. For patients that do not have this, we can still complete the visit using a regular telephone but do prefer a smartphone to enable video when possible. You may have a family member that lives with you that can help. If possible, we also ask that you have a blood pressure cuff and scale at home to measure your blood pressure, heart rate and weight prior to your scheduled appointment. Patients with clinical needs that need an in-person evaluation and testing will still be able to come to the office if absolutely necessary. If you have any questions, feel free to call our office.   CONSENT FOR TELE-HEALTH VISIT - PLEASE REVIEW  I hereby voluntarily request, consent and authorize [facility] and its employed or contracted physicians, physician assistants, nurse practitioners or other licensed health care professionals (the Practitioner), to provide me with telemedicine health care services (the "Services") as deemed necessary by the treating Practitioner. I acknowledge and consent to receive the Services by the Practitioner via telemedicine. I  understand that the telemedicine visit will involve communicating with the Practitioner through live audiovisual communication technology and the disclosure of certain medical information by electronic transmission. I acknowledge that I have been given the opportunity to request an in-person assessment or other available alternative prior to the telemedicine visit and am voluntarily participating in the telemedicine visit.  I understand that I have the right to withhold or withdraw my consent to the use of telemedicine in the course of my care at any time, without affecting my right to future care or treatment, and that the Practitioner or I may terminate the telemedicine visit at any time. I understand that I have the right to inspect all information obtained and/or recorded in the course of the telemedicine visit and may receive copies of available information for a reasonable fee.  I understand that some of the potential risks of receiving the Services via telemedicine include:  Marland Kitchen. Delay or interruption in medical evaluation due to technological equipment failure or disruption; . Information transmitted may not be sufficient (e.g. poor resolution of images) to allow for appropriate medical decision making by the Practitioner; and/or  . In rare instances, security protocols could fail, causing a breach of personal health information.  Furthermore, I acknowledge that it is my responsibility to provide information about my medical history, conditions and care that is complete and accurate to the best of my ability. I acknowledge that Practitioner's advice, recommendations, and/or decision may be based on factors not within their control, such as incomplete or inaccurate data provided by me or distortions of diagnostic images or specimens that may result from electronic transmissions. I  understand that the practice of medicine is not an exact science and that Practitioner makes no warranties or guarantees  regarding treatment outcomes. I acknowledge that I will receive a copy of this consent concurrently upon execution via email to the email address I last provided but may also request a printed copy by calling the office of [facility].    I understand that my insurance will be billed for this visit.   I have read or had this consent read to me. . I understand the contents of this consent, which adequately explains the benefits and risks of the Services being provided via telemedicine.  . I have been provided ample opportunity to ask questions regarding this consent and the Services and have had my questions answered to my satisfaction. . I give my informed consent for the services to be provided through the use of telemedicine in my medical care  By participating in this telemedicine visit I agree to the above.

## 2018-09-05 NOTE — Patient Instructions (Addendum)
Medication Instructions:  Refills sent into your pharmacy. Continue current medications.   If you need a refill on your cardiac medications before your next appointment, please call your pharmacy.    Lab work: No new labs needed   If you have labs (blood work) drawn today and your tests are completely normal, you will receive your results only by: Marland Kitchen MyChart Message (if you have MyChart) OR . A paper copy in the mail If you have any lab test that is abnormal or we need to change your treatment, we will call you to review the results.   Testing/Procedures: No new testing needed   Follow-Up: At [facility], you and your health needs are our priority.  As part of our continuing mission to provide you with exceptional heart care, we have created designated Provider Care Teams.  These Care Teams include your primary Cardiologist (physician) and Advanced Practice Providers (APPs -  Physician Assistants and Nurse Practitioners) who all work together to provide you with the care you need, when you need it.  . You will need a follow up appointment in 12 months .   Please call our office 2 months in advance to schedule this appointment.    . Providers on your designated Care Team:   . Nicolasa Ducking, NP . Eula Listen, PA-C . Marisue Ivan, PA-C  Any Other Special Instructions Will Be Listed Below (If Applicable).  For educational health videos Log in to : [email or web address] Or : [email or web address], password : [email or web address]

## 2018-09-29 ENCOUNTER — Telehealth: Payer: Self-pay | Admitting: Cardiovascular Disease

## 2018-10-02 NOTE — Telephone Encounter (Signed)
Last lipid profile in Care Everywhere was 07/2017. Patient had telemedicine visit with Dr Rockey Situ on 09/05/18 and it was noted patient take 0.5 tablet daily.  Routing to Dr Rockey Situ to clarify how patient should be taking 1 tablet or 1/2 tablet.

## 2018-10-02 NOTE — Telephone Encounter (Signed)
Please review the refill for Vytorin. I do not see the patient has had lipids checked in a while.

## 2018-10-03 NOTE — Telephone Encounter (Signed)
As far as I know she is on a half a pill as documented You can always confirm with her

## 2018-10-04 NOTE — Telephone Encounter (Signed)
Called patient and she is taking half a pill. Prescription sent in to reflect this.

## 2019-01-15 ENCOUNTER — Other Ambulatory Visit: Payer: Self-pay | Admitting: Family Medicine

## 2019-01-15 DIAGNOSIS — Z1231 Encounter for screening mammogram for malignant neoplasm of breast: Secondary | ICD-10-CM

## 2019-03-05 ENCOUNTER — Other Ambulatory Visit: Payer: Self-pay

## 2019-03-05 ENCOUNTER — Ambulatory Visit
Admission: RE | Admit: 2019-03-05 | Discharge: 2019-03-05 | Disposition: A | Discharge status: Home / Self Care | Payer: BC Managed Care – PPO | Source: Ambulatory Visit | Attending: Family Medicine | Admitting: Family Medicine

## 2019-03-05 DIAGNOSIS — Z1231 Encounter for screening mammogram for malignant neoplasm of breast: Secondary | ICD-10-CM

## 2019-03-05 IMAGING — MG DIGITAL SCREENING BILAT W/ TOMO W/ CAD
8 series · 8 of 24 positions shown · non-contrast
Comparison: Previous exam(s).

CLINICAL DATA: Screening.

EXAM:
DIGITAL SCREENING BILATERAL MAMMOGRAM WITH TOMO AND CAD

[R MLO synth-2D]
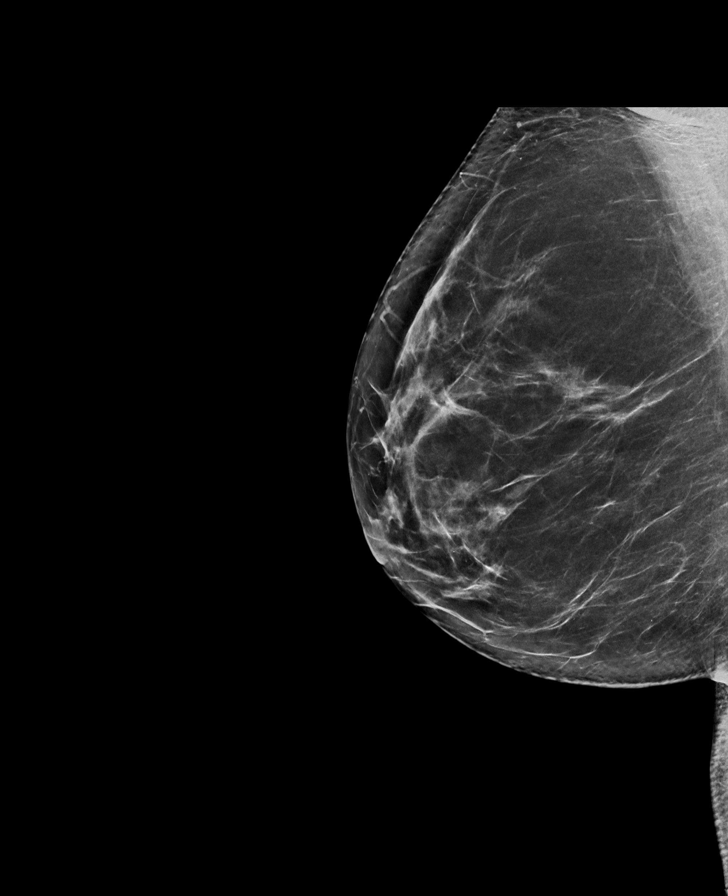

[L MLO synth-2D]
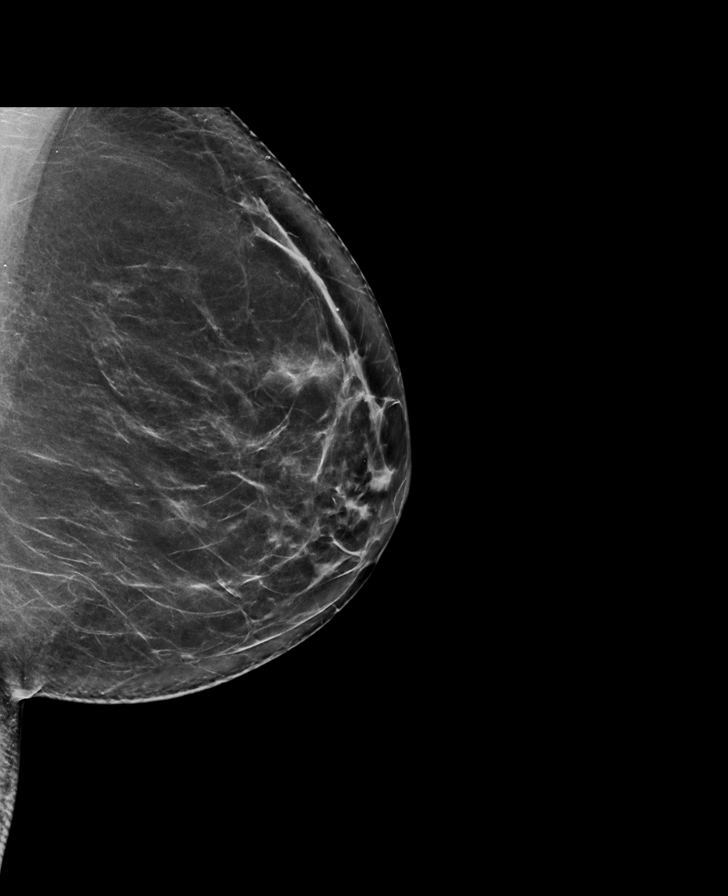

[L CC synth-2D]
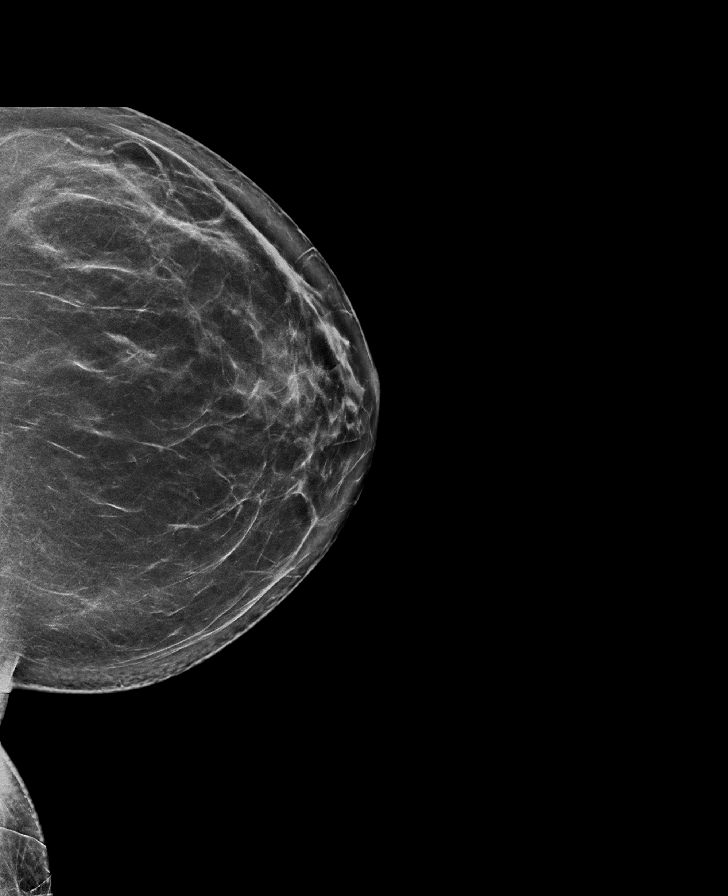

[R CC synth-2D]
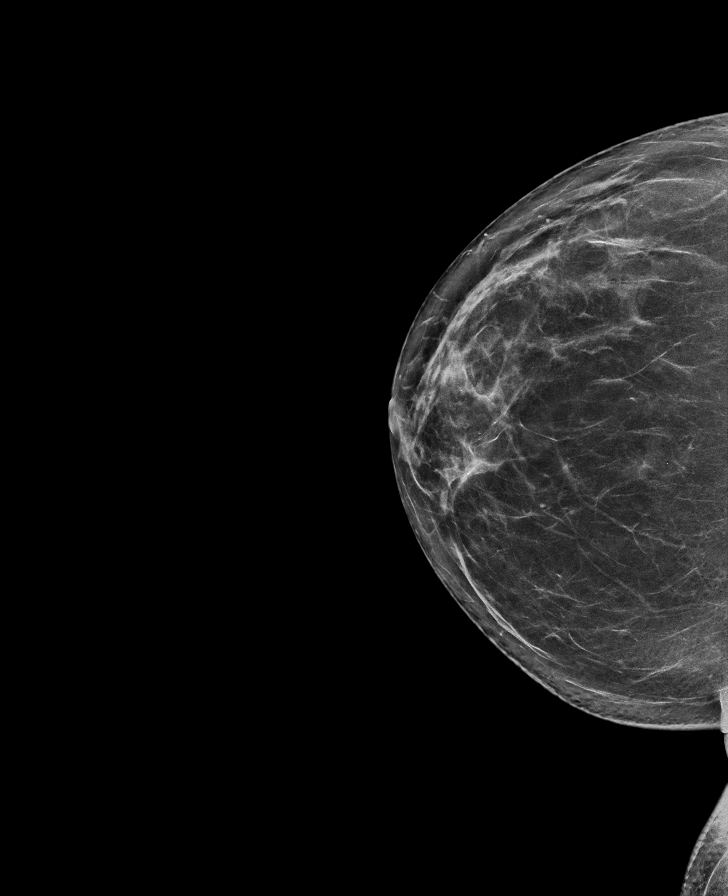

[R CC tomo · tomo slice 39/78.0]
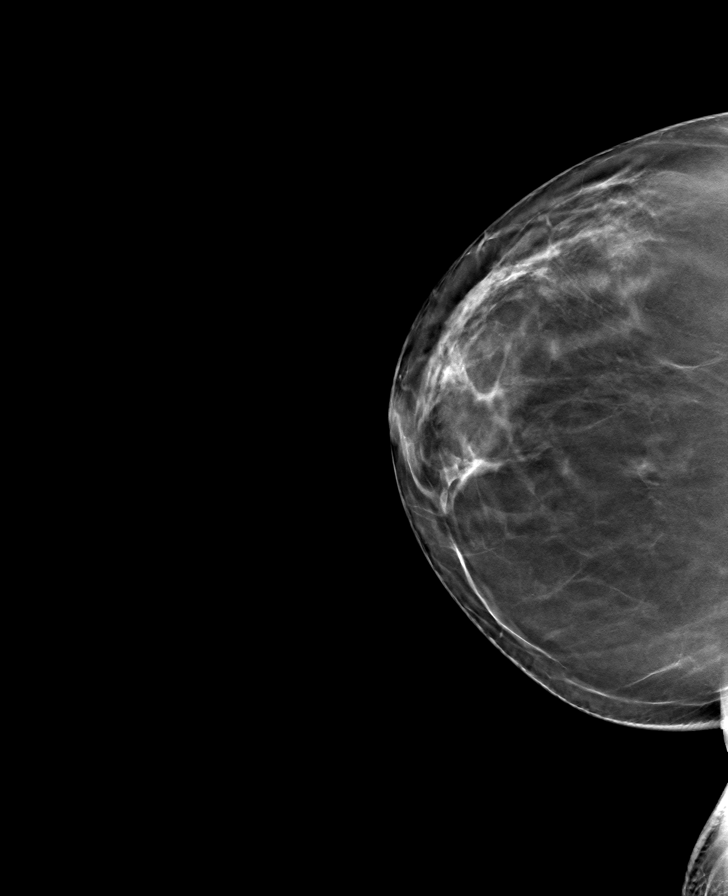

[L MLO tomo · tomo slice 44/87.0]
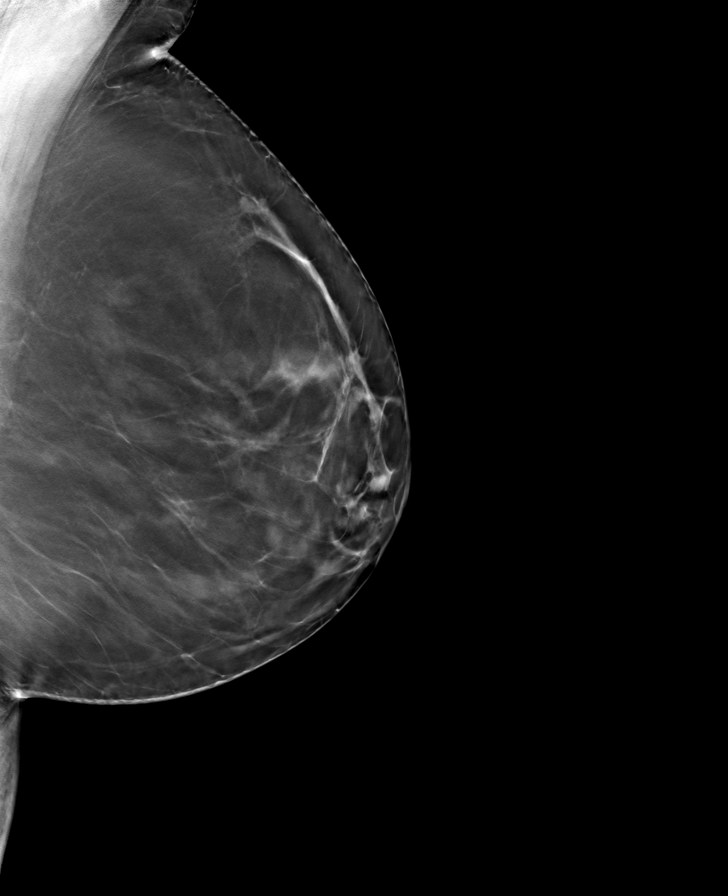

[R MLO tomo · tomo slice 42/83.0]
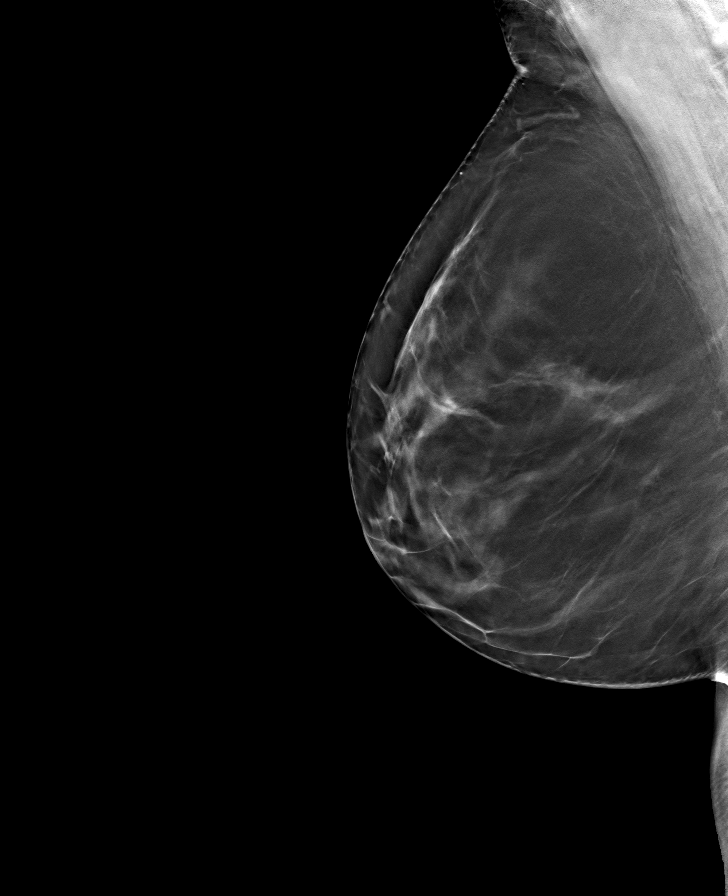

[L CC tomo · tomo slice 43/86.0]
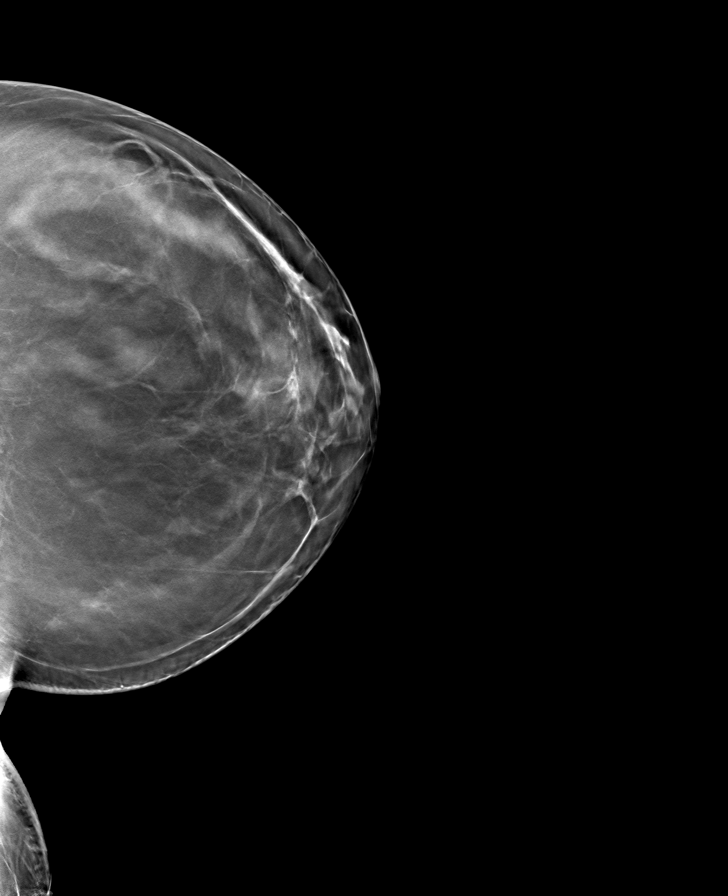

[8 of 24 positions shown; findings below may reference images not displayed]

ACR Breast Density Category b: There are scattered areas of
fibroglandular density.
FINDINGS: There are no findings suspicious for malignancy. Images were
processed with CAD.
IMPRESSION: No mammographic evidence of malignancy. A result letter of this
screening mammogram will be mailed directly to the patient.

RECOMMENDATION:
Screening mammogram in one year. (Code:[TQ])

BI-RADS CATEGORY  1: Negative.

## 2019-09-10 NOTE — Progress Notes (Signed)
Office Visit    Patient Name: Monica Colon Date of Encounter: 09/11/2019  Primary Care Provider:  Gustavo Lah, MD Primary Cardiologist:  Ida Rogue, MD Electrophysiologist:  None   Chief Complaint    Monica Colon is a 59 y.o. female with a hx of palpitations, HTN, HLD, pericarditis in 2014 treated with colchicine and NSAIDs presents today for follow-up of HTN, HLD  Past Medical History    Past Medical History:  Diagnosis Date  . Heart murmur    at age 70  . Hyperlipidemia   . Hypertension    Past Surgical History:  Procedure Laterality Date  . TONSILLECTOMY    . TRIGGER FINGER RELEASE Right    Allergies  Allergies  Allergen Reactions  . Penicillins     Has patient had a PCN reaction causing immediate rash, facial/tongue/throat swelling, SOB or lightheadedness with hypotension: Yes Has patient had a PCN reaction causing severe rash involving mucus membranes or skin necrosis: No Has patient had a PCN reaction that required hospitalization: No Has patient had a PCN reaction occurring within the last 10 years: No If all of the above answers are "NO", then may proceed with Cephalosporin use.    History of Present Illness    Monica Colon is a 60 y.o. female with a hx of HTN, HLD, palpitations, pericarditis in 2014 treated with colchicine and NSAIDs.  She was last seen 09/2018 via telemedicine Dr. Rockey Situ.  Previous cardiac work-up includes stress echo at [facility] 12/2012 showing normal stress test, normal diastolic dysfunction, trivial pericardial effusion, maximum workload of 5.6 METS.  Stress test 03/2017 with worsening with no significant ischemia.    Labs 02/2019 via care everywhere  K2.9, creatinine 1.0, AST 31, ALT 23, GFR 72  Total cholesterol 160, LDL 87, still 57, triglycerides 81  Reports feeling overall well. Continues to work from home. Has been trying to walk for exercise. Intermittent palpitations for which she takes  Propranolol with good response - requires less than once per week. BP at home periodically 134/79. Was recommended to increase her Losartan to 75 mg daily by her PCP but was hesitant. Tells me every once in awhile she wil get pain at her epigastric juinction. Takes PPI PRN. We discussed that this is likely reflux.  Asks about her low HR as her mother had hx of sick sinus syndrome requiring PPM. No lightheadedness, dizziness, near syncope. Not on AV nodal blocking agents.   EKGs/Labs/Other Studies Reviewed:   The following studies were reviewed today:  EKG:  EKG is ordered today.  The ekg ordered today demonstrates SB 58 bpm with no acute ST/T wave changes.   Recent Labs: No results found for requested labs within last 8760 hours.  Recent Lipid Panel No results found for: CHOL, TRIG, HDL, CHOLHDL, VLDL, LDLCALC, LDLDIRECT  Home Medications   Current Meds  Medication Sig  . aspirin 81 MG tablet Take 81 mg by mouth daily.  . Biotin (SUPER BIOTIN) 5 MG TABS Take 1 tablet by mouth daily.  . cholecalciferol (VITAMIN D3) 25 MCG (1000 UNIT) tablet Take 5,000 Units by mouth daily.  . Cod Liver Oil 5000-500 UNIT/5ML OIL Take 1 tablet by mouth daily.  Marland Kitchen ezetimibe-simvastatin (VYTORIN) 10-10 MG tablet Take 0.5 tablets by mouth daily.  . famotidine (PEPCID) 40 MG tablet Take 1 tablet (40 mg total) by mouth daily.  . fish oil-omega-3 fatty acids 1000 MG capsule Take 2 g by mouth daily.    Marland Kitchen  hydrochlorothiazide (MICROZIDE) 12.5 MG capsule Take 1 capsule (12.5 mg total) by mouth daily.  Marland Kitchen losartan (COZAAR) 50 MG tablet Take 1 tablet (50 mg total) by mouth daily. (Patient taking differently: Take 75 mg by mouth daily. Patient only takes 50mg  daily most days)  . MAGNESIUM CITRATE PO Take by mouth daily. Liquid-1 teaspoon daily  . Multiple Vitamin (MULTIVITAMIN) capsule Take 1 capsule by mouth daily.    omeprazole (PRILOSEC) 40 MG capsule Take 40 mg by mouth daily as needed.   . propranolol (INDERAL)  10 MG tablet Take 1 tablet (10 mg total) by mouth 3 (three) times daily as needed.      Review of Systems       Review of Systems  Constitution: Negative for chills, fever and malaise/fatigue.  Cardiovascular: Negative for chest pain, dyspnea on exertion, leg swelling, near-syncope, orthopnea, palpitations and syncope.  Respiratory: Negative for cough, shortness of breath and wheezing.   Gastrointestinal: Negative for nausea and vomiting.  Neurological: Negative for dizziness, light-headedness and weakness.   All other systems reviewed and are otherwise negative except as noted above.  Physical Exam    VS:  BP 140/84 (BP Location: Left Arm, Patient Position: Sitting, Cuff Size: Normal)   Pulse (!) 58   Ht 5\' 5"  (1.651 m)   Wt 153 lb 8 oz (69.6 kg)   SpO2 99%   BMI 25.54 kg/m  , BMI Body mass index is 25.54 kg/m. GEN: Well nourished, well developed, in no acute distress. HEENT: normal. Neck: Supple, no JVD, carotid bruits, or masses. Cardiac: RRR, bradycardic, no murmurs, rubs, or gallops. No clubbing, cyanosis, edema.  Radials/DP/PT 2+ and equal bilaterally.  Respiratory:  Respirations regular and unlabored, clear to auscultation bilaterally. GI: Soft, nontender, nondistended, BS + x 4. MS: No deformity or atrophy. Skin: Warm and dry, no rash. Neuro:  Strength and sensation are intact. Psych: Normal affect.  Assessment & Plan    1. HTN -BP mildly elevated.  Not at goal of less than 130/80.  Was previously recommended to increase losartan to 75 mg daily by her PCP but was hesitant.  Encouraged her to do so today.  Refill provided for losartan at 75 mg daily dose. 2. HLD- 02/2019 LDL 87.  At goal of less than 100.  Continue Vytorin 10-10 mg half tablet daily. 3. Palpitations-reports they are well controlled.  Notes they are triggered by caffeine and tries to avoid.  Continue propranolol as needed. 4. Bradycardia -heart rate routinely in 50s.  Not on any AV nodal blocking  agents.  Asymptomatic in the lightheadedness, dizziness, near syncope.  Notable family history of sick sinus syndrome PPM in mother.  She was educated to report any lightheadedness, dizziness, near syncope and would consider ZIO monitor at that time.  Disposition: Follow up in 6 month(s) with Dr. 03/2019 or APP.    12-10, NP 09/11/2019, 10:32 AM

## 2019-09-11 ENCOUNTER — Other Ambulatory Visit: Payer: Self-pay

## 2019-09-11 ENCOUNTER — Ambulatory Visit (INDEPENDENT_AMBULATORY_CARE_PROVIDER_SITE_OTHER): Payer: BC Managed Care – PPO | Admitting: Family

## 2019-09-11 ENCOUNTER — Encounter: Payer: Self-pay | Admitting: Family

## 2019-09-11 VITALS — BP 140/84 | HR 58 | Ht 65.0 in | Wt 153.5 lb

## 2019-09-11 DIAGNOSIS — E782 Mixed hyperlipidemia: Secondary | ICD-10-CM | POA: Diagnosis not present

## 2019-09-11 DIAGNOSIS — R002 Palpitations: Secondary | ICD-10-CM | POA: Diagnosis not present

## 2019-09-11 DIAGNOSIS — I10 Essential (primary) hypertension: Secondary | ICD-10-CM

## 2019-09-11 MED ORDER — LOSARTAN POTASSIUM 50 MG PO TABS: 75.0000 mg | ORAL_TABLET | Freq: Every day | ORAL | 1 refills | Status: DC

## 2019-09-11 MED ORDER — PROPRANOLOL HCL 10 MG PO TABS: 10.0000 mg | ORAL_TABLET | Freq: Three times a day (TID) | ORAL | 0 refills | Status: DC | PRN

## 2019-09-11 NOTE — Patient Instructions (Signed)
Medication Instructions:  Your physician has recommended you make the following change in your medication:   CHANGE Losartan to 75 mg daily (1.5 tablet)  *If you need a refill on your cardiac medications before your next appointment, please call your pharmacy*  Lab Work: No lab work today.   Testing/Procedures: Your EKG today shows sinus bradycardia. This is a stable but slow heart rhythm.   Follow-Up: At [facility], you and your health needs are our priority.  As part of our continuing mission to provide you with exceptional heart care, we have created designated Provider Care Teams.  These Care Teams include your primary Cardiologist (physician) and Advanced Practice Providers (APPs -  Physician Assistants and Nurse Practitioners) who all work together to provide you with the care you need, when you need it.  We recommend signing up for the patient portal called "MyChart".  Sign up information is provided on this After Visit Summary.  MyChart is used to connect with patients for Virtual Visits (Telemedicine).  Patients are able to view lab/test results, encounter notes, upcoming appointments, etc.  Non-urgent messages can be sent to your provider as well.   To learn more about what you can do with MyChart, go to [email or web address].    Your next appointment:   6 month(s)  The format for your next appointment:   In Person  Provider:   You may see Julien Nordmann, MD or one of the following Advanced Practice Providers on your designated Care Team:    Nicolasa Ducking, NP  Eula Listen, PA-C  Marisue Ivan, PA-C  Gillian Shields, NP  Other Instructions  If you notice lightheadedness, dizziness, episodes of almost passing out or passing out, please call our office.

## 2019-09-19 ENCOUNTER — Other Ambulatory Visit: Payer: Self-pay

## 2019-09-19 MED ORDER — HYDROCHLOROTHIAZIDE 12.5 MG PO CAPS: 12.5000 mg | ORAL_CAPSULE | Freq: Every day | ORAL | 3 refills | Status: DC

## 2020-01-21 ENCOUNTER — Other Ambulatory Visit: Payer: Self-pay | Admitting: Family Medicine

## 2020-01-21 DIAGNOSIS — Z1231 Encounter for screening mammogram for malignant neoplasm of breast: Secondary | ICD-10-CM

## 2020-03-10 ENCOUNTER — Other Ambulatory Visit: Payer: Self-pay

## 2020-03-10 ENCOUNTER — Ambulatory Visit
Admission: RE | Admit: 2020-03-10 | Discharge: 2020-03-10 | Disposition: A | Discharge status: Home / Self Care | Payer: BC Managed Care – PPO | Source: Ambulatory Visit | Attending: Family Medicine | Admitting: Family Medicine

## 2020-03-10 DIAGNOSIS — Z1231 Encounter for screening mammogram for malignant neoplasm of breast: Secondary | ICD-10-CM

## 2020-03-10 IMAGING — MG DIGITAL SCREENING BILAT W/ TOMO W/ CAD
8 series · 8 of 24 positions shown · non-contrast
Comparison: Previous exam(s).

CLINICAL DATA: Screening.

EXAM:
DIGITAL SCREENING BILATERAL MAMMOGRAM WITH TOMO AND CAD

[L CC synth-2D]
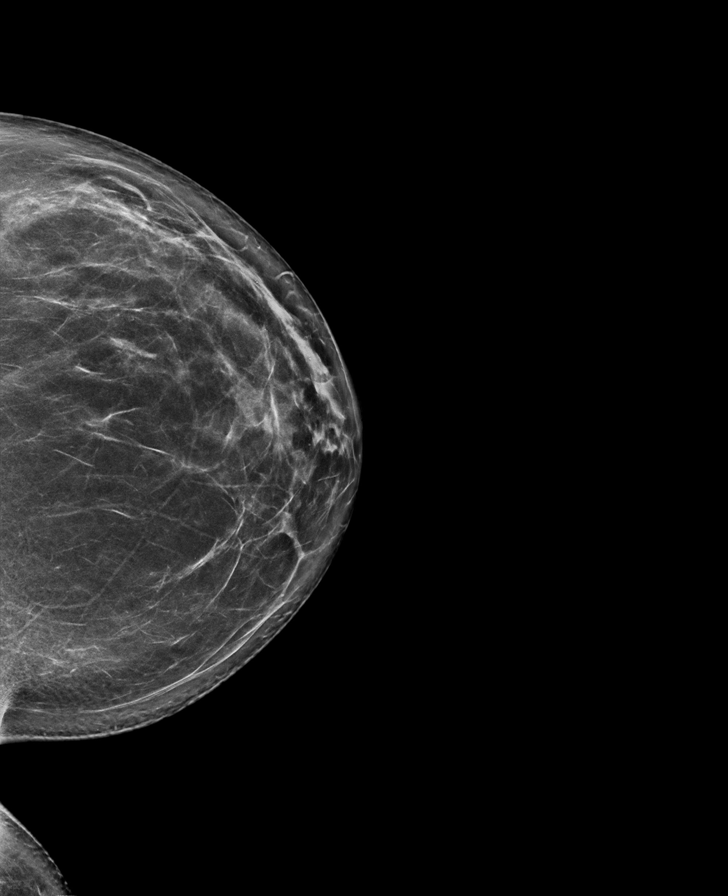

[R MLO synth-2D]
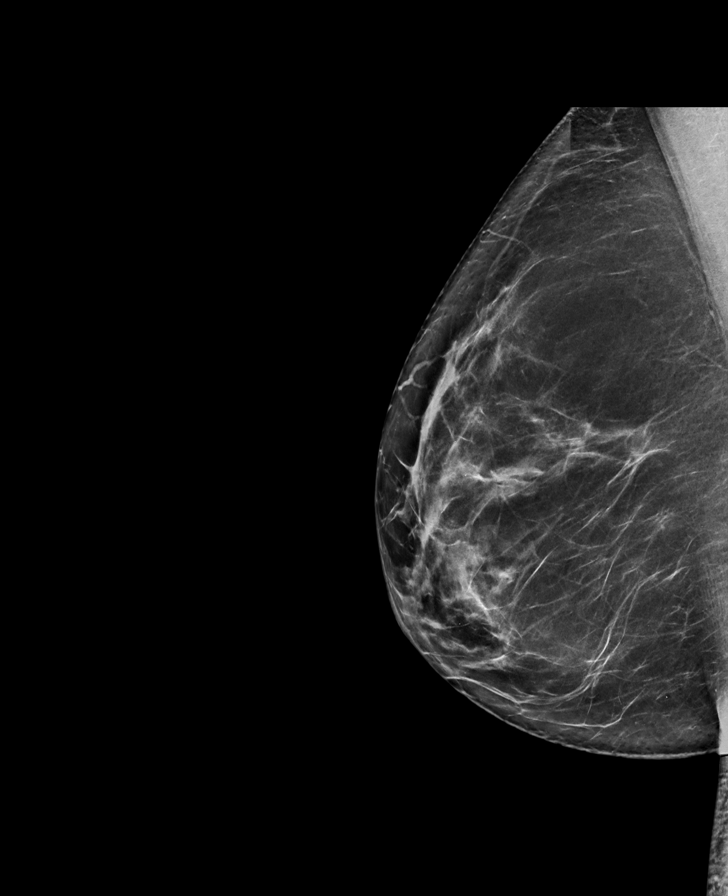

[L MLO synth-2D]
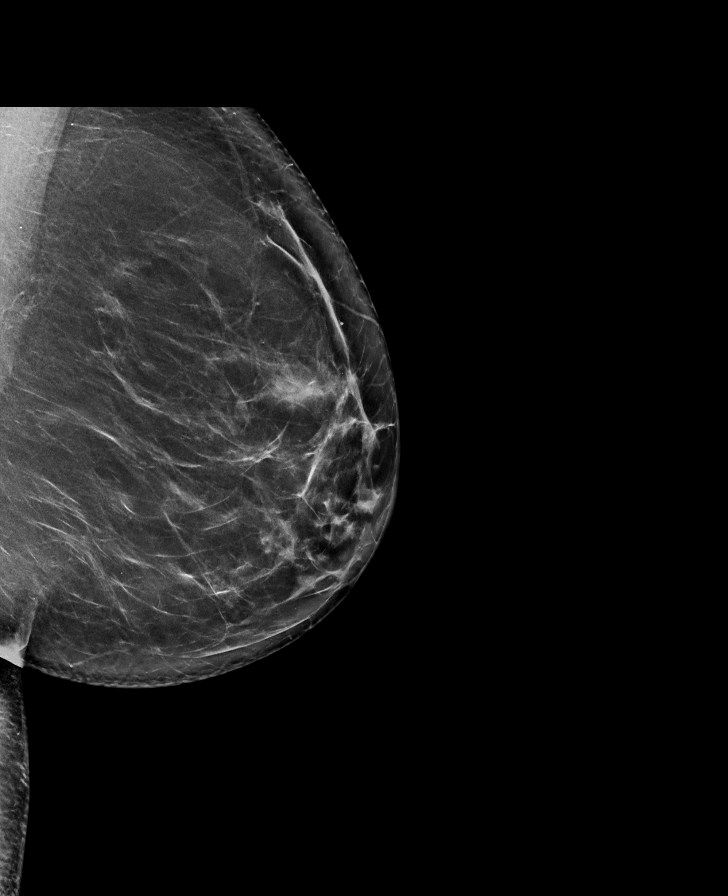

[R CC synth-2D]
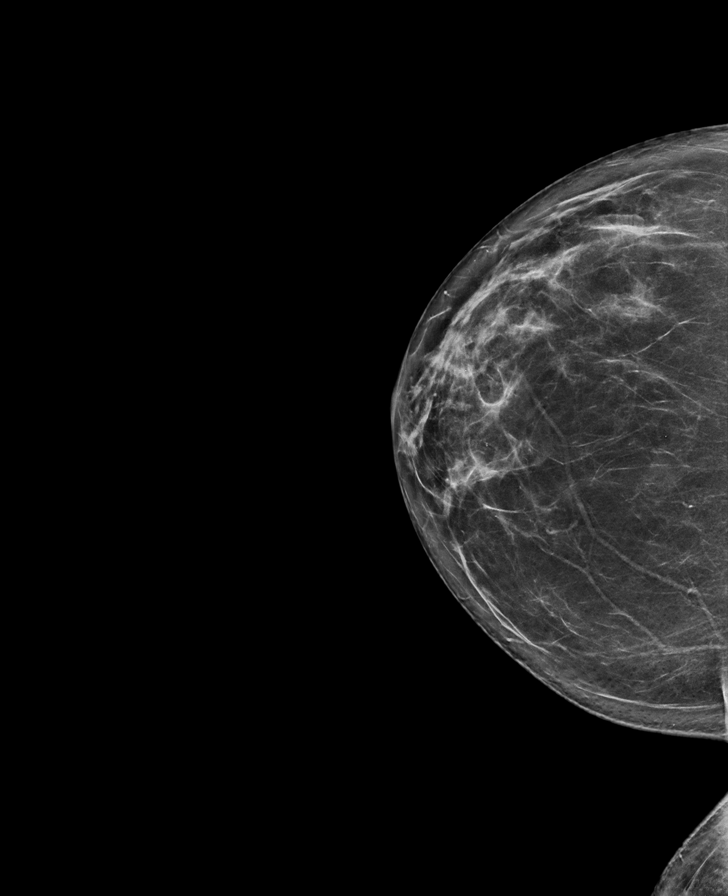

[L CC tomo · tomo slice 42/83.0]
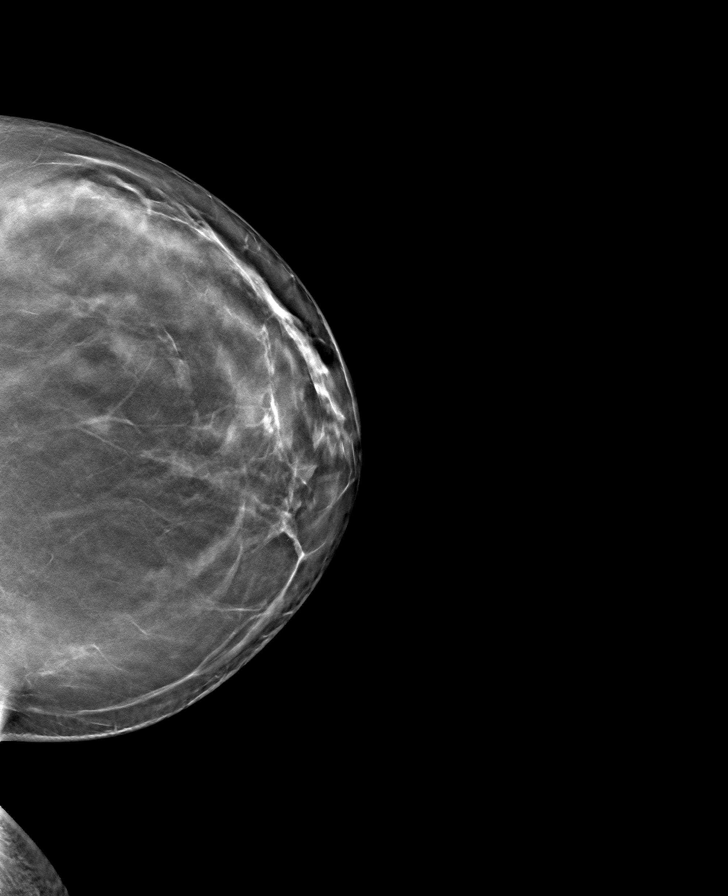

[R CC tomo · tomo slice 39/77.0]
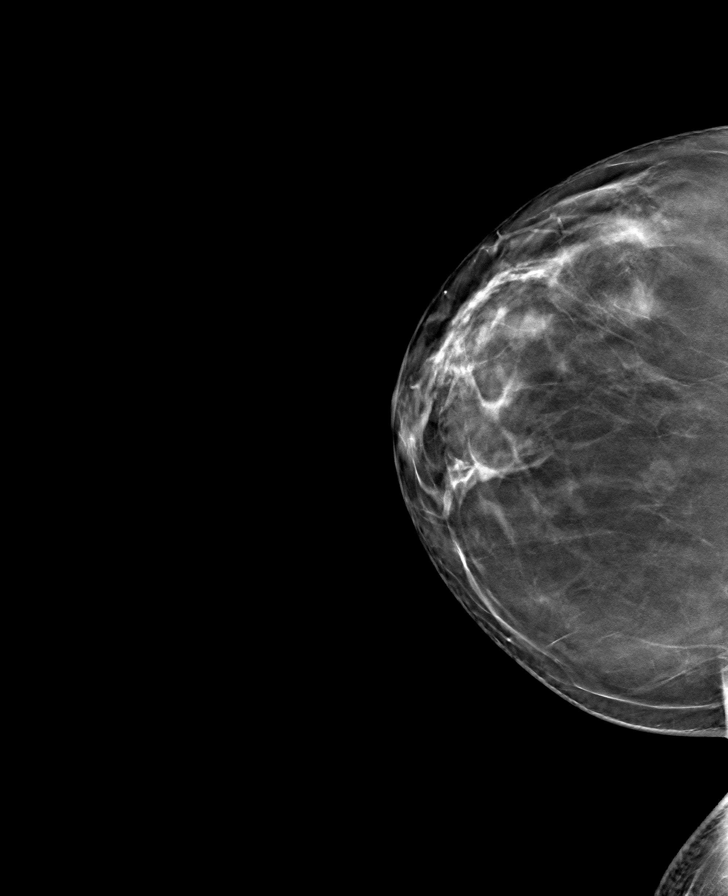

[R MLO tomo · tomo slice 41/82.0]
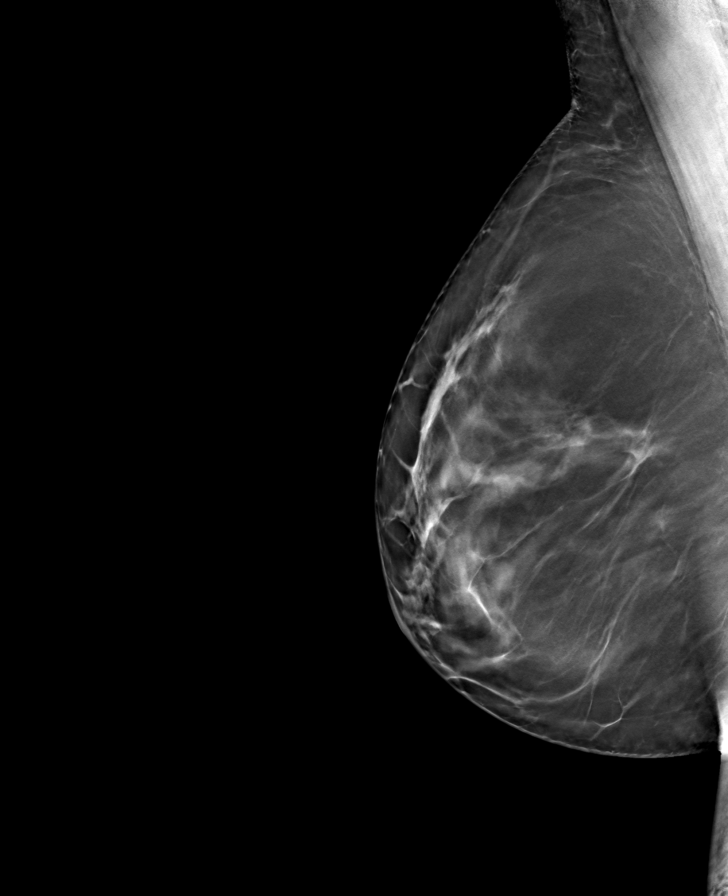

[L MLO tomo · tomo slice 44/87.0]
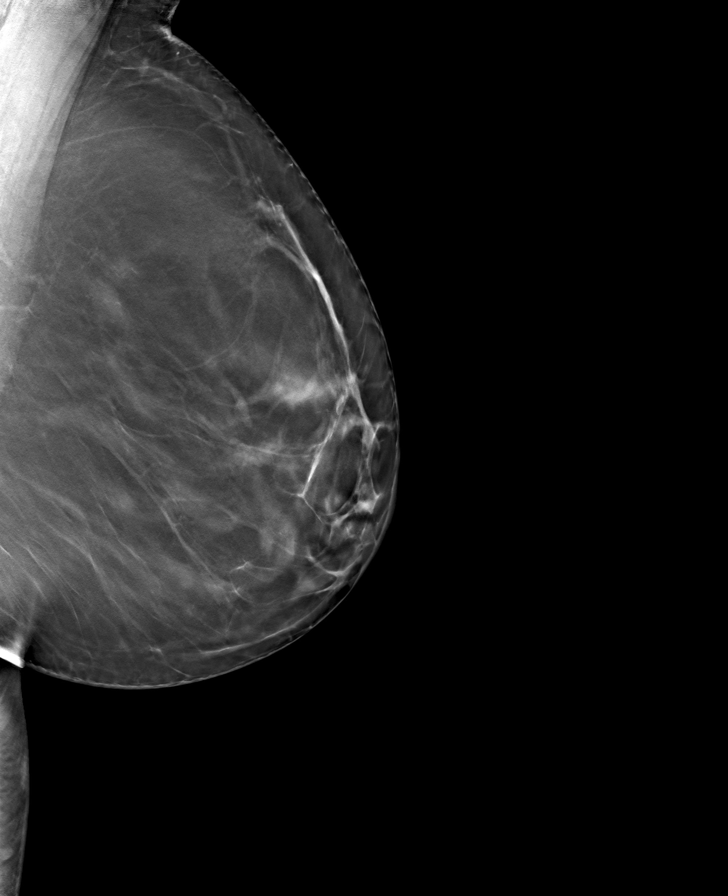

[8 of 24 positions shown; findings below may reference images not displayed]

ACR Breast Density Category b: There are scattered areas of
fibroglandular density.
FINDINGS: There are no findings suspicious for malignancy. Images were
processed with CAD.
IMPRESSION: No mammographic evidence of malignancy. A result letter of this
screening mammogram will be mailed directly to the patient.

RECOMMENDATION:
Screening mammogram in one year. (Code:[TQ])

BI-RADS CATEGORY  1: Negative.

## 2020-03-17 NOTE — Progress Notes (Signed)
Take care for        Date:  03/17/2020   ID:  [name], DOB 1961-01-15, MRN [id number]  Patient Location:  [place] [address]   Provider location:   [facility], [place] office  PCP:  Gustavo Lah, MD  Cardiologist:  Arvid Right [facility]   Chief Complaint  Patient presents with  . Follow-up    6 Months follow up. Medications verbally reviewed with patient.      History of Present Illness:    Monica Colon is a 59 y.o. female  past medical history of palpitations and fluttering in her chest dating back to 2009  hypertension,  hyperlipidemia previously seen for left arm pain.  Diagnosed with pericarditis in 2014 with abnormal EKG, diffuse T-wave abnormality, treated with colchicine and NSAIDs. 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 went to [facility]. Workup in the emergency room with negative cardiac enzymes, negative chest x-ray. She was given GI cocktail.  Previously seen in the emergency room at [facility]. Potassium was 3.1. She had a second visit to [facility]. Over the course of her 2 visits, she had significant testing including stress echo that showed no wall motion abnormality, chest x-rays, chest  CT scan showing left greater than right pleural effusion with small pericardial effusion, atelectasis the left, neck CTA which was essentially normal. Notes indicate she was instructed to take anti-inflammatories  Initial EKG at [facility] August 20 13,014 showed no significant ST-T wave changes Potassium at [facility] was 3.3  Other blood work of note  at [place] included elevated ESR and CRP. CRP was 16, sedimentation rate 100. Normal cardiac enzymes   Prior CV studies:   The following studies were reviewed today:    Past Medical History:  Diagnosis Date  . Heart murmur    at age 77  . Hyperlipidemia   . Hypertension    Past Surgical History:  Procedure Laterality Date  . TONSILLECTOMY    . TRIGGER FINGER RELEASE Right      No outpatient medications have been marked as taking for the 03/18/20 encounter (Appointment) with Minna Merritts, MD.     Allergies:   Penicillins   Social History   Tobacco Use  . Smoking status: Never Smoker  . Smokeless tobacco: Never Used  Vaping Use  . Vaping Use: Never used  Substance Use Topics  . Alcohol use: Yes    Alcohol/week: 1.0 standard drink    Types: 1 Glasses of wine per week    Comment: social drinker.  . Drug use: No     Current Outpatient Medications on File Prior to Visit  Medication Sig Dispense Refill  . aspirin 81 MG tablet Take 81 mg by mouth  daily.    . Biotin (SUPER BIOTIN) 5 MG TABS Take 1 tablet by mouth daily.    . cholecalciferol (VITAMIN D3) 25 MCG (1000 UNIT) tablet Take 5,000 Units by mouth daily.    . Cod Liver Oil 5000-500 UNIT/5ML OIL Take 1 tablet by mouth daily.    Marland Kitchen ezetimibe-simvastatin (VYTORIN) 10-10 MG tablet Take 0.5 tablets by mouth daily. 45 tablet 3  . famotidine (PEPCID) 40 MG tablet Take 1 tablet (40 mg total) by mouth daily.    . fish oil-omega-3 fatty acids 1000 MG capsule Take 2 g by mouth daily.      . hydrochlorothiazide (MICROZIDE) 12.5 MG capsule Take 1 capsule (12.5 mg total) by  mouth daily. 90 capsule 3  . losartan (COZAAR) 50 MG tablet Take 1.5 tablets (75 mg total) by mouth daily. Patient only takes 76m daily most days 135 tablet 1  . MAGNESIUM CITRATE PO Take by mouth daily. Liquid-1 teaspoon daily    . Multiple Vitamin (MULTIVITAMIN) capsule Take 1 capsule by mouth daily.      .Marland Kitchenomeprazole (PRILOSEC) 40 MG capsule Take 40 mg by mouth daily as needed.     . Probiotic Product (ALIGN) 4 MG CAPS Take 1 tablet by mouth daily.      . propranolol (INDERAL) 10 MG tablet Take 1 tablet (10 mg total) by mouth 3 (three) times daily as needed. 90 tablet 0   No current facility-administered medications on file prior to visit.     Family Hx: The patient's family history includes Breast cancer in her paternal aunt; Hyperlipidemia in her mother; Hypertension in her mother.  ROS:   Please see the history of present illness.    Review of Systems  Constitutional: Negative.   Respiratory: Negative.   Cardiovascular: Negative.   Gastrointestinal: Negative.   Musculoskeletal: Negative.   Neurological: Negative.   Psychiatric/Behavioral: Negative.   All other systems reviewed and are negative.    Labs/Other Tests and Data Reviewed:    Recent Labs: No results found for requested labs within last 8760 hours.   Recent Lipid Panel No results found for: CHOL, TRIG, HDL, CHOLHDL, LDLCALC, LDLDIRECT  Wt Readings from Last 3 Encounters:  09/11/19 153 lb 8 oz (69.6 kg)  04/07/17 149 lb 8 oz (67.8 kg)  03/21/17 150 lb (68 kg)     Exam:   BP 126/80 (BP Location: Left Arm, Patient Position: Sitting, Cuff Size: Normal)   Pulse (!) 58   Ht _0  (1.651 m)   Wt 148 lb (67.1 kg)   SpO2 96%   BMI 24.63 kg/m  Constitutional:  oriented to person, place, and time. No distress.  HENT:  Head: Grossly normal Eyes:  no discharge. No scleral icterus.  Neck: No JVD, no carotid bruits  Cardiovascular: Regular rate and rhythm, no murmurs appreciated Pulmonary/Chest: Clear to  auscultation bilaterally, no wheezes or rails Abdominal: Soft.  no distension.  no tenderness.  Musculoskeletal: Normal range of motion Neurological:  normal muscle tone. Coordination normal. No atrophy Skin: Skin warm and dry Psychiatric: normal affect, pleasant  ASSESSMENT & PLAN:    Chest pain, unspecified type No significant sx, No further wrokuop Low risk, no diabetes, no smoking, cholesterol well controlled  Essential hypertension Blood pressure is well controlled on today's visit. No changes made to the medications.  Palpitations Not taking propranolol Sx stable  Mixed hyperlipidemia Cholesterol is at goal on the current lipid regimen. No changes to the medications were made.    Total  encounter time more than 25 minutes  Greater than 50% was spent in counseling and coordination of care with the patient   Signed, Ida Rogue, MD  03/17/2020 6:24 PM    [place] [facility] [id number], [place], [place] [address]

## 2020-03-18 ENCOUNTER — Other Ambulatory Visit: Payer: Self-pay

## 2020-03-18 ENCOUNTER — Ambulatory Visit (INDEPENDENT_AMBULATORY_CARE_PROVIDER_SITE_OTHER): Payer: BC Managed Care – PPO | Admitting: Cardiovascular Disease

## 2020-03-18 ENCOUNTER — Encounter: Payer: Self-pay | Admitting: Cardiovascular Disease

## 2020-03-18 VITALS — BP 126/80 | HR 58 | Ht 65.0 in | Wt 148.0 lb

## 2020-03-18 DIAGNOSIS — R079 Chest pain, unspecified: Secondary | ICD-10-CM

## 2020-03-18 DIAGNOSIS — I10 Essential (primary) hypertension: Secondary | ICD-10-CM | POA: Diagnosis not present

## 2020-03-18 DIAGNOSIS — E782 Mixed hyperlipidemia: Secondary | ICD-10-CM

## 2020-03-18 DIAGNOSIS — R002 Palpitations: Secondary | ICD-10-CM

## 2020-03-18 NOTE — Patient Instructions (Signed)
Medication Instructions:  No changes  If you need a refill on your cardiac medications before your next appointment, please call your pharmacy.    Lab work: No new labs needed   If you have labs (blood work) drawn today and your tests are completely normal, you will receive your results only by: . MyChart Message (if you have MyChart) OR . A paper copy in the mail If you have any lab test that is abnormal or we need to change your treatment, we will call you to review the results.   Testing/Procedures: No new testing needed   Follow-Up: At [facility], you and your health needs are our priority.  As part of our continuing mission to provide you with exceptional heart care, we have created designated Provider Care Teams.  These Care Teams include your primary Cardiologist (physician) and Advanced Practice Providers (APPs -  Physician Assistants and Nurse Practitioners) who all work together to provide you with the care you need, when you need it.  . You will need a follow up appointment as needed  . Providers on your designated Care Team:   . Christopher Berge, NP . Ryan Dunn, PA-C . Jacquelyn Visser, PA-C  Any Other Special Instructions Will Be Listed Below (If Applicable).  COVID-19 Vaccine Information can be found at: [email or web address] For questions related to vaccine distribution or appointments, please email [email or web address] or call [phone].     

## 2020-04-25 ENCOUNTER — Other Ambulatory Visit
Admission: RE | Admit: 2020-04-25 | Discharge: 2020-04-25 | Disposition: A | Discharge status: Home / Self Care | Payer: BC Managed Care – PPO | Source: Ambulatory Visit | Attending: Physician Assistant | Admitting: Physician Assistant

## 2020-04-25 ENCOUNTER — Ambulatory Visit (INDEPENDENT_AMBULATORY_CARE_PROVIDER_SITE_OTHER): Payer: BC Managed Care – PPO | Admitting: Physician Assistant

## 2020-04-25 ENCOUNTER — Other Ambulatory Visit: Payer: Self-pay | Admitting: Physician Assistant

## 2020-04-25 ENCOUNTER — Ambulatory Visit: Payer: BC Managed Care – PPO | Admitting: Cardiovascular Disease

## 2020-04-25 ENCOUNTER — Other Ambulatory Visit: Payer: Self-pay

## 2020-04-25 ENCOUNTER — Encounter: Payer: Self-pay | Admitting: Physician Assistant

## 2020-04-25 VITALS — BP 148/80 | HR 63 | Ht 65.0 in | Wt 158.0 lb

## 2020-04-25 DIAGNOSIS — I10 Essential (primary) hypertension: Secondary | ICD-10-CM | POA: Diagnosis not present

## 2020-04-25 DIAGNOSIS — R079 Chest pain, unspecified: Secondary | ICD-10-CM | POA: Insufficient documentation

## 2020-04-25 DIAGNOSIS — R002 Palpitations: Secondary | ICD-10-CM | POA: Insufficient documentation

## 2020-04-25 DIAGNOSIS — E782 Mixed hyperlipidemia: Secondary | ICD-10-CM | POA: Diagnosis not present

## 2020-04-25 DIAGNOSIS — R700 Elevated erythrocyte sedimentation rate: Secondary | ICD-10-CM

## 2020-04-25 DIAGNOSIS — Z8679 Personal history of other diseases of the circulatory system: Secondary | ICD-10-CM

## 2020-04-25 DIAGNOSIS — Z8719 Personal history of other diseases of the digestive system: Secondary | ICD-10-CM

## 2020-04-25 DIAGNOSIS — R0789 Other chest pain: Secondary | ICD-10-CM

## 2020-04-25 LAB — CBC WITH DIFFERENTIAL/PLATELET
Abs Immature Granulocytes: 0.04 10*3/uL (ref 0.00–0.07)
Basophils Absolute: 0 10*3/uL (ref 0.0–0.1)
Basophils Relative: 0 %
Eosinophils Absolute: 0 10*3/uL (ref 0.0–0.5)
Eosinophils Relative: 0 %
HCT: 41.7 % (ref 36.0–46.0)
Hemoglobin: 14.3 g/dL (ref 12.0–15.0)
Immature Granulocytes: 0 %
Lymphocytes Relative: 3 %
Lymphs Abs: 0.3 10*3/uL — ABNORMAL LOW (ref 0.7–4.0)
MCH: 32 pg (ref 26.0–34.0)
MCHC: 34.3 g/dL (ref 30.0–36.0)
MCV: 93.3 fL (ref 80.0–100.0)
Monocytes Absolute: 0.4 10*3/uL (ref 0.1–1.0)
Monocytes Relative: 3 %
Neutro Abs: 10.7 10*3/uL — ABNORMAL HIGH (ref 1.7–7.7)
Neutrophils Relative %: 94 %
Platelets: 302 10*3/uL (ref 150–400)
RBC: 4.47 MIL/uL (ref 3.87–5.11)
RDW: 12.3 % (ref 11.5–15.5)
WBC: 11.5 10*3/uL — ABNORMAL HIGH (ref 4.0–10.5)
nRBC: 0 % (ref 0.0–0.2)

## 2020-04-25 LAB — COMPREHENSIVE METABOLIC PANEL
ALT: 20 U/L (ref 0–44)
AST: 24 U/L (ref 15–41)
Albumin: 3.8 g/dL (ref 3.5–5.0)
Alkaline Phosphatase: 50 U/L (ref 38–126)
Anion gap: 10 (ref 5–15)
BUN: 11 mg/dL (ref 6–20)
CO2: 28 mmol/L (ref 22–32)
Calcium: 9.6 mg/dL (ref 8.9–10.3)
Chloride: 99 mmol/L (ref 98–111)
Creatinine, Ser: 0.79 mg/dL (ref 0.44–1.00)
GFR, Estimated: >60 mL/min (ref >60)
Glucose, Bld: 107 mg/dL — ABNORMAL HIGH (ref 70–99)
Potassium: 4 mmol/L (ref 3.5–5.1)
Sodium: 137 mmol/L (ref 135–145)
Total Bilirubin: 1 mg/dL (ref 0.3–1.2)
Total Protein: 7.1 g/dL (ref 6.5–8.1)

## 2020-04-25 LAB — LIPID PANEL
Cholesterol: 169 mg/dL (ref 0–200)
HDL: 60 mg/dL (ref >40)
LDL Cholesterol: 96 mg/dL (ref 0–99)
Total CHOL/HDL Ratio: 2.8 RATIO
Triglycerides: 63 mg/dL (ref <150)
VLDL: 13 mg/dL (ref 0–40)

## 2020-04-25 LAB — BRAIN NATRIURETIC PEPTIDE: B Natriuretic Peptide: 63.9 pg/mL (ref 0.0–100.0)

## 2020-04-25 LAB — TROPONIN I (HIGH SENSITIVITY): Troponin I (High Sensitivity): 5 ng/L (ref <18)

## 2020-04-25 LAB — TSH: TSH: 1.967 u[IU]/mL (ref 0.350–4.500)

## 2020-04-25 MED ORDER — COLCHICINE 0.6 MG PO TABS: 0.6000 mg | ORAL_TABLET | Freq: Two times a day (BID) | ORAL | 0 refills | Status: DC

## 2020-04-25 MED ORDER — OMEPRAZOLE 40 MG PO CPDR: 40.0000 mg | DELAYED_RELEASE_CAPSULE | Freq: Every day | ORAL | 3 refills | Status: AC | PRN

## 2020-04-25 NOTE — Patient Instructions (Signed)
Medication Instructions:  Your physician recommends that you continue on your current medications as directed. Please refer to the Current Medication list given to you today.  *If you need a refill on your cardiac medications before your next appointment, please call your pharmacy*  Lab Work: Your physician recommends that you return for lab work in: TODAY- CBC, CMET, BNP, TSH, LIPIDS, STAT TROPONIN.  If you have labs (blood work) drawn today and your tests are completely normal, you will receive your results only by: Marland Kitchen MyChart Message (if you have MyChart) OR . A paper copy in the mail If you have any lab test that is abnormal or we need to change your treatment, we will call you to review the results.   Testing/Procedures: Your physician has requested that you have an echocardiogram. Echocardiography is a painless test that uses sound waves to create images of your heart. It provides your doctor with information about the size and shape of your heart and how well your heart's chambers and valves are working. This procedure takes approximately one hour. There are no restrictions for this procedure. There is a possibility that an IV may need to be started during your test to inject an image enhancing agent. This is done to obtain more optimal pictures of your heart. Therefore we ask that you do at least drink some water prior to coming in to hydrate your veins.   Follow-Up: At [facility], you and your health needs are our priority.  As part of our continuing mission to provide you with exceptional heart care, we have created designated Provider Care Teams.  These Care Teams include your primary Cardiologist (physician) and Advanced Practice Providers (APPs -  Physician Assistants and Nurse Practitioners) who all work together to provide you with the care you need, when you need it.  We recommend signing up for the patient portal called "MyChart".  Sign up information is provided on this  After Visit Summary.  MyChart is used to connect with patients for Virtual Visits (Telemedicine).  Patients are able to view lab/test results, encounter notes, upcoming appointments, etc.  Non-urgent messages can be sent to your provider as well.   To learn more about what you can do with MyChart, go to [email or web address].    Your next appointment:   After the echo.  The format for your next appointment:   In Person  Provider:   You may see Julien Nordmann, MD or one of the following Advanced Practice Providers on your designated Care Team:    Nicolasa Ducking, NP  Eula Listen, PA-C  Marisue Ivan, PA-C  Cadence Rio, [place]  Gillian Shields, NP

## 2020-04-25 NOTE — Progress Notes (Signed)
Office Visit    Patient Name: Monica Colon Date of Encounter: 04/25/2020  Primary Care Provider:  Gustavo Lah, MD Primary Cardiologist:  Ida Rogue, MD  Chief Complaint    Chief Complaint  Patient presents with  . OTHER    C/o chest pain since about 5:00 this morning. Medications verbally reviewed with patient.     60 year old female with history of palpitations, hypertension, hyperlipidemia, pericarditis in 2014 treated with colchicine and NSAIDs, and who presents today for right-sided chest pain that started at 5 AM this morning.  Past Medical History    Past Medical History:  Diagnosis Date  . Heart murmur    at age 3  . Hyperlipidemia   . Hypertension    Past Surgical History:  Procedure Laterality Date  . TONSILLECTOMY    . TRIGGER FINGER RELEASE Right     Allergies  Allergies  Allergen Reactions  . Penicillins     Has patient had a PCN reaction causing immediate rash, facial/tongue/throat swelling, SOB or lightheadedness with hypotension: Yes Has patient had a PCN reaction causing severe rash involving mucus membranes or skin necrosis: No Has patient had a PCN reaction that required hospitalization: No Has patient had a PCN reaction occurring within the last 10 years: No If all of the above answers are "NO", then may proceed with Cephalosporin use.    History of Present Illness    Monica Colon is a 60 y.o. female with PMH as above. She has history of hypertension, hyperlipidemia, palpitations, 2014 pericarditis. She was last seen by Dr. Rockey Situ 03/18/2020.  Previous cardiac work-up includes stress echo at [facility] 12/2012 -normal stress test, trivial pericardial effusion. 03/2017 stress test performed without significant ischemia.  She works from home. She has been trying to walk for exercise. She reports intermittent palpitations for which she takes propranolol with good response. She tries to avoid caffeine. She requires less than  1 propanolol per week. BP 134/79 at home. She takes a PPI as needed. She occasionally reports a pain at her epigastric junction. She has noted concern regarding her low heart rate in the past, given her mother's history of sick sinus syndrome, requiring PPM.  Seen in office by her primary cardiologist 03/18/2020. She reported no recent chest pain. Overall, she is ruled low risk given her lack of diabetes, tobacco use. Cholesterol is well controlled. BP well controlled. She was not taking her propranolol with palpitations stable. She reported taking care of her mother. She was taking ASA 81 mg daily for years for unclear reasons. She reportedly needed a colonoscopy.   Today, 04/25/20, she presents to clinic after a colonoscopy and endoscopy (the previous day) with report of constant chest pain that started overnight. At the time of our visit, CP 7.5-8/10. She reports she got up to use the restroom around 6AM and was on her way back to bed when the CP started. At first, she attributed the CP to gas, given her recent GI procedure. However, she then had a bowel movement that AM and also passed gas without any change or alleviation in her discomfort.  She reports that this feels different from gas discomfort. The CP is described as substernal aching or pain that was not TTP on my exam, though she did feel some discomfort when pressing on it herself. It is similar to the CP she experienced in 2014, at which time she had pericarditis and pleural effusions, and she wonders if this could be the etiology.  The CP initially did not radiate; however, which seated in the waiting room, it started to radiate to between her shoulder blades. With her 2014 pericarditis, she found it difficult to breathe with her CP.  She had not yet had this difficulty breathing until the time of our appointment, during which time she stated that it also became difficult for her to breathe.  She reports that during her pericarditis, she actually  found it more painful to lean forward, which we discussed is atypical for pericarditis.  On exam today, she experienced no relief with leaning forward.  Of note, given her current chest pain, high-sensitivity troponin was collected at the time of her visit and negative as outlined below.  EKG reviewed with DOD and showed nonspecific ST/T changes. Initial oxygen saturation 93% on room air with subsequent oxygen saturation measured in 97%.  She was not tachycardic.  She denied any asymmetric lower extremity edema, erythema, warmth, or palpable cord.  She denied presyncope, syncope, nausea, emesis, lower extremity edema, early satiety, or other symptoms of volume overload.  No reported exposure to illness.  No recent fevers.  No signs or symptoms of bleeding.  She reportedly called the gastroenterologist, and they instructed her to speak with her cardiologist regarding her discomfort.  She has not attempted to utilize any propanolol since the CP started and reports she rarely uses this medication as she rarely has palpitations.  She reports home blood pressure 150s over 80s.  Home Medications    Current Outpatient Medications on File Prior to Visit  Medication Sig Dispense Refill  . Biotin 5 MG TABS Take 1 tablet by mouth daily.    . cholecalciferol (VITAMIN D3) 25 MCG (1000 UNIT) tablet Take 5,000 Units by mouth daily.    Marland Kitchen ezetimibe-simvastatin (VYTORIN) 10-10 MG tablet Take 0.5 tablets by mouth daily. 45 tablet 3  . fish oil-omega-3 fatty acids 1000 MG capsule Take 2 g by mouth daily.    . hydrochlorothiazide (MICROZIDE) 12.5 MG capsule Take 1 capsule (12.5 mg total) by mouth daily. 90 capsule 3  . linaclotide (LINZESS) 290 MCG CAPS capsule Take 290 mcg by mouth daily.    Marland Kitchen losartan (COZAAR) 50 MG tablet Take 1.5 tablets (75 mg total) by mouth daily. Patient only takes 58m daily most days 135 tablet 1  . MAGNESIUM CITRATE PO Take by mouth daily. Liquid-1 teaspoon daily    . Multiple Vitamin  (MULTIVITAMIN) capsule Take 1 capsule by mouth daily.    .Marland Kitchenomeprazole (PRILOSEC) 40 MG capsule Take 40 mg by mouth daily as needed.     . Probiotic Product (ALIGN) 4 MG CAPS Take 1 tablet by mouth daily.    . propranolol (INDERAL) 10 MG tablet Take 1 tablet (10 mg total) by mouth 3 (three) times daily as needed. 90 tablet 0   No current facility-administered medications on file prior to visit.    Review of Systems    She denies chest pain, palpitations, dyspnea, pnd, orthopnea, n, v, dizziness, syncope, edema, weight gain, or early satiety.  She reports substernal chest pain that is constant and began approximately 6 AM this morning.  Chest pain radiates between the shoulder blades.  It is tender to palpation on her exam but not tender to palpation during my own physical exam.  She describes his pain as not alleviated by any passing of gas or bowel movement.  She does not experience relief of CP by leaning forward.  All other systems reviewed and are otherwise negative  except as noted above.  Physical Exam    VS:  BP (!) 148/80 (BP Location: Left Arm, Patient Position: Sitting, Cuff Size: Normal)   Pulse 63   Ht 5' 5"  (1.651 m)   Wt 158 lb (71.7 kg)   SpO2 93%   BMI 26.29 kg/m  , BMI Body mass index is 26.29 kg/m. GEN: Well nourished, well developed, in no acute distress. HEENT: normal. Neck: Supple, no JVD, carotid bruits, or masses. Cardiac: RRR, no murmurs, pericardial friction rub appreciated upon having the patient leaned forward during the exam.  No gallops. No clubbing, cyanosis.  Trace to minimal bilateral edema. radials/DP/PT 2+ and equal bilaterally.  Respiratory:  Respirations regular and unlabored, clear to auscultation bilaterally. GI: Soft, nontender, nondistended, BS + x 4. MS: no deformity or atrophy. Skin: warm and dry, no rash. Neuro:  Strength and sensation are intact. Psych: Normal affect.  Accessory Clinical Findings    ECG personally reviewed by me today -NSR,  63 bpm, PR interval 134 ms, QRS 76 ms, QTC 390 ms, baseline wander, poor R wave progression in leads II, V1 to V3- no acute changes. Repeat EKG performed and confirm no acute ST/T changes on EKG.  VITALS Reviewed today   Temp Readings from Last 3 Encounters:  03/21/17 97.7 F (36.5 C) (Oral)   BP Readings from Last 3 Encounters:  04/25/20 (!) 148/80  03/18/20 126/80  09/11/19 140/84   Pulse Readings from Last 3 Encounters:  04/25/20 63  03/18/20 (!) 58  09/11/19 (!) 58    Wt Readings from Last 3 Encounters:  04/25/20 158 lb (71.7 kg)  03/18/20 148 lb (67.1 kg)  09/11/19 153 lb 8 oz (69.6 kg)     LABS  reviewed today   Lab Results  Component Value Date   WBC 5.1 03/21/2017   HGB 14.3 03/21/2017   HCT 42.6 03/21/2017   MCV 94.8 03/21/2017   PLT 358 03/21/2017   Lab Results  Component Value Date   CREATININE 0.90 03/21/2017   BUN 16 03/21/2017   NA 140 03/21/2017   K 3.6 03/21/2017   CL 99 (L) 03/21/2017   CO2 33 (H) 03/21/2017   No results found for: ALT, AST, GGT, ALKPHOS, BILITOT No results found for: CHOL, HDL, LDLCALC, LDLDIRECT, TRIG, CHOLHDL  No results found for: HGBA1C No results found for: TSH   STUDIES/PROCEDURES reviewed today   Low risk exercise stress test from 2014. Please see hyperlink under CV procedures tab.  Interpretation: Normal Stress Test.  Note: TRIVIAL PR, TR  NORMAL DIASTOLIC FUNCTION  TRIVIAL PERICARDIAL EFFUSION  PLEURAL EFFUSION NOTED  RESTING ST CHANGES DID NOT CHANGE WITH STRESS  Maximum workload of 5.60 METs was achieved during exercise.     Findings:  1. No evidence for pulmonary embolism.  2. Left lower lobe consolidative opacities; differential diagnosis  radiographically includes atelectasis, infection or aspiration.  3. Small circumferential pericardial effusion and small left-sided pleural  effusion.     Assessment & Plan    Chest pain of unknown etiology History of pericarditis --CP as  described above in HPI. CP atypical in that it has been constant pain and did not begin with exertion.  Chest pain initially is suspicious for GI etiology, given her recent endoscopy and colonoscopy; however, she reports passing of gas and stool without any relief of her discomfort. Her BP is elevated, which could be contributing to her discomfort. She denies any tachypalpitations. Given her history of previous pericarditis, patient was instructed  to lean forward without report of relief of chest pain but also with pericardial friction rub appreciated on exam.  EKG notable for nonspecific changes.  Echo ordered to rule out pericarditis or myocarditis.  In addition, high-sensitivity troponin collected and to be resulted stat.  Given her current chest pain, we discussed that the official referral for chest pain is to refer her to the ED.  Reds vest not consistent with volume overload, despite her weight gain from the previous visit.  Oxygen saturation initially 93% with patient report of cold hands and on repeat pulse ox 97%.  She is not tachycardic.  She denies any signs or symptoms consistent with DVT.  Low suspicion for PE at this time.  She denies any exposure to COVID-19.  She does report that she is vaccinated and expecting her booster vaccine soon.  At this time, and given her current chest pain, cannot completely rule out cardiac etiology, though she is low risk as she is not a smoker, not a diabetic, and currently has well controlled cholesterol.  Thus, we discussed that the official recommendation is for her to present directly to the ED at this time.  She would prefer to avoid waiting in the ED and COVID-19 exposure. After discussing all the options, she indicates a desire to instead first collect a high-sensitivity troponin. She understands and is agreeable to go to the ED if this is abnormal.  Labs collected also included BNP, CMP, CBC, TSH, lipids.As above, we will also obtain an echo. If HS TN negative,  and based on review of EMR and Dr. Donivan Scull previous notes that recommend PRN tx with NSAIDs and colchicine with PPI for any further CP after initial pericarditis episode, we will plan for treatment of pericarditis. Will discuss with primary MD as well.   Essential HTN --BP suboptimal in the setting of current pain and anxiety. No medication changes at this time and pending labs. In the future, with ongoing CP and elevated BP, consider Imdur or Bidil. Will defer for now.   Palpitations --No recent tachypalpitations. Not taking her propanolol. We discussed that this could alleviate her discomfort, given the antianginal effects of BB. No medication changes.  HLD, goal <100 --LDL controlled. No medication changes.  Medication changes: Pending results of high-sensitivity troponin, plan for treatment of pericarditis with high-dose ibuprofen 800 mg 3 times daily x1 week and subsequent ibuprofen 600 mg 3 times daily times the following week.  We will refill her omeprazole to provide her with gastrointestinal protection during this time.  In addition, recommend colchicine 0.6 mg twice daily x1 to 3 months to prevent recurrence.   Labs ordered: C-Met, lipids, CBC, TSH, high-sensitivity troponin, BNP Studies / Imaging ordered: Echocardiogram Future considerations: BP control if pressure still elevated at RTC. Consider checking CRP and further inflammatory workup if ongoing CP. Will send labs to PCP per pt request.  Disposition: RTC in person after the echo, sooner if indicated  Total time spent with patient today 45 minutes. This includes reviewing records, evaluating the patient, and coordinating care. Face-to-face time >50%.    Arvil Chaco, PA-C 04/25/2020

## 2020-04-28 ENCOUNTER — Telehealth: Payer: Self-pay | Admitting: Physician Assistant

## 2020-04-28 ENCOUNTER — Other Ambulatory Visit: Payer: Self-pay | Admitting: Physician Assistant

## 2020-04-28 DIAGNOSIS — D72829 Elevated white blood cell count, unspecified: Secondary | ICD-10-CM

## 2020-04-28 NOTE — Telephone Encounter (Signed)
Patient was seen last week in clinic by Leafy Kindle who recommended a covid test to rule out chest pain. Patient is scheduled to do a covid test this morning at [facility] on [address]. Patient states she needs a doctors order before she gets tested. [facility] has asked for an electronic order  Please advise as patient is on her way now

## 2020-04-28 NOTE — Progress Notes (Signed)
Ordered COVID-19 test based on sx reported in clinic Friday 04/25/20.

## 2020-04-28 NOTE — Telephone Encounter (Signed)
Discussed prescription for getting COVID tested with manager.  Patient should be able to get COVID test free of charge. We are not prescribing tests at this time.  Called patient and she said [facility]'s information said she needed prescription for insurance for self test.  Advised patient the [facility] may have different process and she should call PCP for further testing information or may need to schedule at another place. She was appreciative and verbalized understanding.

## 2020-05-08 ENCOUNTER — Ambulatory Visit (INDEPENDENT_AMBULATORY_CARE_PROVIDER_SITE_OTHER): Payer: BC Managed Care – PPO

## 2020-05-08 ENCOUNTER — Other Ambulatory Visit: Payer: Self-pay

## 2020-05-08 DIAGNOSIS — R079 Chest pain, unspecified: Secondary | ICD-10-CM | POA: Diagnosis not present

## 2020-05-08 LAB — ECHOCARDIOGRAM COMPLETE
AR max vel: 2.4 cm2
AV Area VTI: 2.17 cm2
AV Area mean vel: 2.2 cm2
AV Mean grad: 3 mmHg
AV Peak grad: 6.1 mmHg
Ao pk vel: 1.23 m/s
Area-P 1/2: 3.61 cm2
Calc EF: 57.5 %
S' Lateral: 2.8 cm
Single Plane A2C EF: 61.5 %
Single Plane A4C EF: 52.9 %

## 2020-05-17 NOTE — Progress Notes (Signed)
Take care for        Date:  05/19/2020   ID:  Monica Colon, DOB 1960-06-07, MRN [id number]  Patient Location:  [place] [address]   Provider location:   [facility], [place] office  PCP:  Gustavo Lah, MD  Cardiologist:  Arvid Right [facility]   Chief Complaint  Patient presents with  . F/U after echo    History of Present Illness:    SARRAH Colon is a 60 y.o. female  past medical history of palpitations and fluttering in her chest dating back to 2009  hypertension,  hyperlipidemia previously seen for left arm pain.  Diagnosed with pericarditis in 2014 with abnormal EKG, diffuse T-wave abnormality, treated with colchicine and NSAIDs. Stress test  showing ejection fraction, no ischemia 2018 presents today for follow-up of her palpitations and chest pressure/pain  Seen by one of our providers April 25, 2020 That time had atypical chest pain Troponin negative BNP normal  Echocardiogram primary 2022, normal ejection fraction Grade 2 diastolic dysfunction  Sx better with NSAIDS   helps take care of her mother  Having more palpitations, Taking propranolol, having some breakthrough  Not exercising   Lab Results  Component Value Date   CHOL 169 04/25/2020   HDL 60 04/25/2020   LDLCALC 96 04/25/2020   TRIG 63 04/25/2020    CT neck and chest:  No atherosclerosis, CAD  Other past medical hx Previously had chest pain symptoms radiating up in her clavicle and neck area. went to [facility]. Workup in the emergency room with negative cardiac enzymes, negative chest x-ray. She was given GI cocktail.  Previously seen in the emergency room at [facility]. Potassium was 3.1. She had a second visit to [facility]. Over the course of her 2 visits, she had significant testing including stress echo that showed no wall motion abnormality, chest x-rays, chest CT scan showing left greater than right pleural effusion with small  pericardial effusion, atelectasis the left, neck CTA which was essentially normal. Notes indicate she was instructed to take anti-inflammatories  Initial EKG at [facility] August 20 13,014 showed no significant ST-T wave changes Potassium at [facility] was 3.3  Other blood work of note  at [place] included elevated ESR and CRP. CRP was 16, sedimentation rate 100. Normal cardiac enzymes   Prior CV studies:   The following studies were reviewed today:    Past Medical History:  Diagnosis Date  . Heart murmur    at age 76  . Hyperlipidemia   . Hypertension    Past Surgical History:  Procedure Laterality Date  . COLONOSCOPY    . TONSILLECTOMY    . TRIGGER FINGER RELEASE Right   . UPPER GI ENDOSCOPY       Current Meds  Medication Sig  . Biotin 5 MG TABS Take 1 tablet by mouth daily.  . cholecalciferol (VITAMIN D3) 25 MCG (1000 UNIT) tablet Take 5,000 Units by mouth daily.  . colchicine 0.6 MG tablet Take 1 tablet (0.6 mg total) by mouth in the morning and at bedtime.  Marland Kitchen ezetimibe-simvastatin (VYTORIN) 10-10 MG tablet Take 0.5 tablets by mouth daily.  . fish oil-omega-3 fatty acids 1000 MG capsule Take 2 g by mouth daily.  . hydrochlorothiazide (MICROZIDE) 12.5 MG capsule Take 1 capsule (12.5 mg total) by mouth daily.  Marland Kitchen linaclotide (LINZESS) 290 MCG CAPS capsule Take 290 mcg by mouth daily.  Marland Kitchen losartan (COZAAR) 50 MG tablet Take 1.5 tablets (75 mg total)  by mouth daily. Patient only takes 67m daily most days  . Multiple Vitamin (MULTIVITAMIN) capsule Take 1 capsule by mouth daily.  .Marland Kitchenomeprazole (PRILOSEC) 40 MG capsule Take 1 capsule (40 mg total) by mouth daily as needed.  . Probiotic Product (ALIGN) 4 MG CAPS Take 1 tablet by mouth daily.  . propranolol (INDERAL) 10 MG tablet Take 1 tablet (10 mg total) by mouth 3 (three) times daily as needed.     Allergies:   Penicillins   Social History   Tobacco Use  . Smoking status: Never Smoker  . Smokeless tobacco: Never Used   Vaping Use  . Vaping Use: Never used  Substance Use Topics  . Alcohol use: Yes    Alcohol/week: 1.0 standard drink    Types: 1 Glasses of wine per week    Comment: social drinker.  . Drug use: No      Family Hx: The patient's family history includes Breast cancer in her paternal aunt; Hyperlipidemia in her mother; Hypertension in her mother.  ROS:   Please see the history of present illness.    Review of Systems  Constitutional: Negative.   Respiratory: Negative.   Cardiovascular: Negative.   Gastrointestinal: Negative.   Musculoskeletal: Negative.   Neurological: Negative.   Psychiatric/Behavioral: Negative.   All other systems reviewed and are negative.    Labs/Other Tests and Data Reviewed:    Recent Labs: 04/25/2020: ALT 20; B Natriuretic Peptide 63.9; BUN 11; Creatinine, Ser 0.79; Hemoglobin 14.3; Platelets 302; Potassium 4.0; Sodium 137; TSH 1.967   Recent Lipid Panel Lab Results  Component Value Date/Time   CHOL 169 04/25/2020 03:42 PM   TRIG 63 04/25/2020 03:42 PM   HDL 60 04/25/2020 03:42 PM   CHOLHDL 2.8 04/25/2020 03:42 PM   LDLCALC 96 04/25/2020 03:42 PM    Wt Readings from Last 3 Encounters:  05/19/20 151 lb (68.5 kg)  04/25/20 158 lb (71.7 kg)  03/18/20 148 lb (67.1 kg)     Exam:   BP (!) 150/100 (BP Location: Left Arm, Patient Position: Sitting, Cuff Size: Normal)   Pulse 67   Ht 5' 5"  (1.651 m)   Wt 151 lb (68.5 kg)   SpO2 98%   BMI 25.13 kg/m  Constitutional:  oriented to person, place, and time. No distress.  HENT:  Head: Grossly normal Eyes:  no discharge. No scleral icterus.  Neck: No JVD, no carotid bruits  Cardiovascular: Regular rate and rhythm, no murmurs appreciated Pulmonary/Chest: Clear to auscultation bilaterally, no wheezes or rails Abdominal: Soft.  no distension.  no tenderness.  Musculoskeletal: Normal range of motion Neurological:  normal muscle tone. Coordination normal. No atrophy Skin: Skin warm and  dry Psychiatric: normal affect, pleasant   ASSESSMENT & PLAN:    Chest pain, unspecified type Doing well, no further workup Normal echo Better with NSAIDS  Essential hypertension Increase losartan up to 100 mg daily extra HCTZ as needed  Palpitations Taking propranolol prn Not much sx If continued sx, could try metoprolol   Mixed hyperlipidemia Cholesterol is at goal on the current lipid regimen. No changes to the medications were made.   Total encounter time more than 25 minutes  Greater than 50% was spent in counseling and coordination of care with the patient   Signed, TIda Rogue MD  05/19/2020 9:08 AM    [id number] [place] [id number]

## 2020-05-19 ENCOUNTER — Other Ambulatory Visit: Payer: Self-pay

## 2020-05-19 ENCOUNTER — Ambulatory Visit (INDEPENDENT_AMBULATORY_CARE_PROVIDER_SITE_OTHER): Payer: BC Managed Care – PPO | Admitting: Cardiovascular Disease

## 2020-05-19 ENCOUNTER — Encounter: Payer: Self-pay | Admitting: Cardiovascular Disease

## 2020-05-19 VITALS — BP 150/100 | HR 67 | Ht 65.0 in | Wt 151.0 lb

## 2020-05-19 DIAGNOSIS — E782 Mixed hyperlipidemia: Secondary | ICD-10-CM

## 2020-05-19 DIAGNOSIS — R002 Palpitations: Secondary | ICD-10-CM

## 2020-05-19 DIAGNOSIS — R079 Chest pain, unspecified: Secondary | ICD-10-CM

## 2020-05-19 DIAGNOSIS — I10 Essential (primary) hypertension: Secondary | ICD-10-CM

## 2020-05-19 MED ORDER — PROPRANOLOL HCL 10 MG PO TABS: 10.0000 mg | ORAL_TABLET | Freq: Three times a day (TID) | ORAL | 3 refills | Status: DC | PRN

## 2020-05-19 MED ORDER — HYDROCHLOROTHIAZIDE 12.5 MG PO CAPS: 12.5000 mg | ORAL_CAPSULE | Freq: Every day | ORAL | 3 refills | Status: DC

## 2020-05-19 MED ORDER — LOSARTAN POTASSIUM 50 MG PO TABS: 100.0000 mg | ORAL_TABLET | Freq: Every day | ORAL | 3 refills | Status: DC

## 2020-05-19 NOTE — Patient Instructions (Addendum)
Medication Instructions:  Please increase the losartan up to 100 mg daily (take once a day) If blood pressure runs high, take extra pill of  HCTZ  propranolol 10 mg, may take up to 3 times a day as needed for elevated heart rate     Lab work: No new labs needed   Testing/Procedures: No new testing needed   Follow-Up: At [facility], you and your health needs are our priority.  As part of our continuing mission to provide you with exceptional heart care, we have created designated Provider Care Teams.  These Care Teams include your primary Cardiologist (physician) and Advanced Practice Providers (APPs -  Physician Assistants and Nurse Practitioners) who all work together to provide you with the care you need, when you need it.  . You will need a follow up appointment in 12 months  . Providers on your designated Care Team:   . Nicolasa Ducking, NP . Eula Listen, PA-C . Marisue Ivan, PA-C  Any Other Special Instructions Will Be Listed Below (If Applicable).  COVID-19 Vaccine Information can be found at: [email or web address] For questions related to vaccine distribution or appointments, please email [email or web address] or call [phone].

## 2021-02-09 ENCOUNTER — Other Ambulatory Visit: Payer: Self-pay

## 2021-02-09 DIAGNOSIS — I10 Essential (primary) hypertension: Secondary | ICD-10-CM | POA: Insufficient documentation

## 2021-02-09 DIAGNOSIS — R42 Dizziness and giddiness: Secondary | ICD-10-CM | POA: Diagnosis present

## 2021-02-09 DIAGNOSIS — Z79899 Other long term (current) drug therapy: Secondary | ICD-10-CM | POA: Insufficient documentation

## 2021-02-09 DIAGNOSIS — E876 Hypokalemia: Secondary | ICD-10-CM | POA: Diagnosis not present

## 2021-02-09 LAB — BASIC METABOLIC PANEL
Anion gap: 7 (ref 5–15)
BUN: 14 mg/dL (ref 6–20)
CO2: 31 mmol/L (ref 22–32)
Calcium: 9.5 mg/dL (ref 8.9–10.3)
Chloride: 101 mmol/L (ref 98–111)
Creatinine, Ser: 0.81 mg/dL (ref 0.44–1.00)
GFR, Estimated: >60 mL/min (ref >60)
Glucose, Bld: 99 mg/dL (ref 70–99)
Potassium: 3 mmol/L — ABNORMAL LOW (ref 3.5–5.1)
Sodium: 139 mmol/L (ref 135–145)

## 2021-02-09 LAB — CBC
HCT: 42.9 % (ref 36.0–46.0)
Hemoglobin: 14.5 g/dL (ref 12.0–15.0)
MCH: 32.1 pg (ref 26.0–34.0)
MCHC: 33.8 g/dL (ref 30.0–36.0)
MCV: 94.9 fL (ref 80.0–100.0)
Platelets: 331 10*3/uL (ref 150–400)
RBC: 4.52 MIL/uL (ref 3.87–5.11)
RDW: 12.2 % (ref 11.5–15.5)
WBC: 6.2 10*3/uL (ref 4.0–10.5)
nRBC: 0 % (ref 0.0–0.2)

## 2021-02-09 NOTE — ED Triage Notes (Signed)
Pt presents to ER c/o dizziness that started originally started October 12.  Pt states it has happened several time, and pt went to UC on Thursday with dx of vertigo and given rx for meclizine.  Pt states this morning the dizziness started again and it has come in constant waves.  Pt denies any other stroke-like symptoms at this time and is here today because she is still having episodes of "spinning after going to UC.".  NIH 0 at this time.  Monica Colon

## 2021-02-10 ENCOUNTER — Emergency Department
Admission: EM | Admit: 2021-02-10 | Discharge: 2021-02-10 | Disposition: A | Discharge status: Home / Self Care | Payer: BC Managed Care – PPO | Attending: Emergency Medicine | Admitting: Emergency Medicine

## 2021-02-10 ENCOUNTER — Emergency Department: Payer: BC Managed Care – PPO

## 2021-02-10 DIAGNOSIS — E876 Hypokalemia: Secondary | ICD-10-CM

## 2021-02-10 DIAGNOSIS — R42 Dizziness and giddiness: Secondary | ICD-10-CM | POA: Diagnosis not present

## 2021-02-10 LAB — TROPONIN I (HIGH SENSITIVITY): Troponin I (High Sensitivity): 5 ng/L (ref <18)

## 2021-02-10 IMAGING — MR MR HEAD W/O CM
12 series · 48 of 48 positions shown · non-contrast
Comparison: None.

CLINICAL DATA: Nonspecific dizziness.

EXAM:
MRI HEAD WITHOUT CONTRAST
TECHNIQUE: Multiplanar, multiecho pulse sequences of the brain and surrounding
structures were obtained without intravenous contrast.

[Series 5: ax dwi_tracew · axial · 3.0mm · 0.65mm/px · z∈[-28,+133]mm · 5 of 50 slices shown]
[im 1/50]
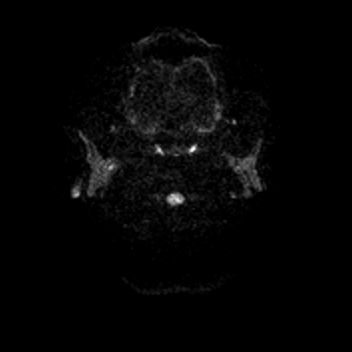
[im 13/50]
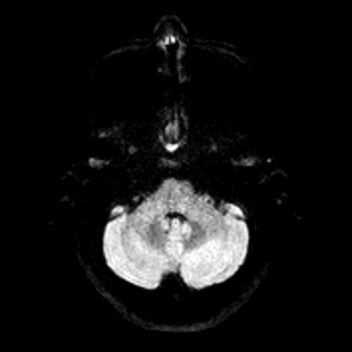
[im 25/50]
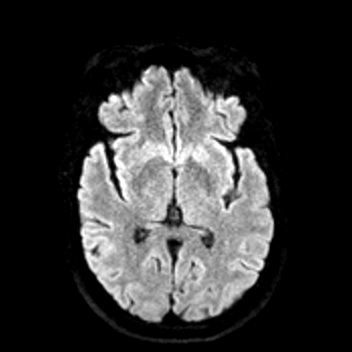
[im 37/50]
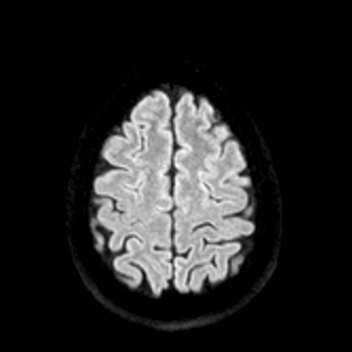
[im 50/50]
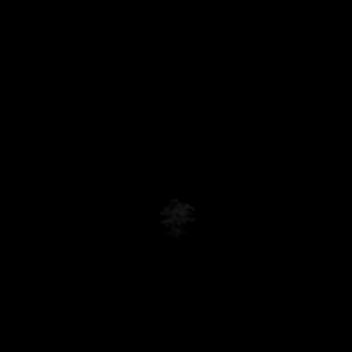

[Series 6: ax dwi_adc · axial · 3.0mm · 0.65mm/px · z∈[-28,+130]mm · 3 of 49 slices shown]
[im 1/49]
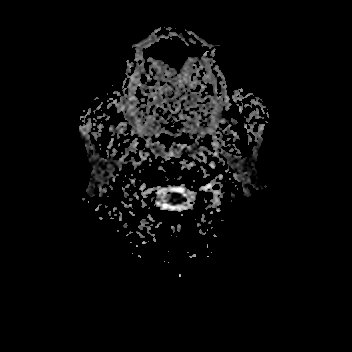
[im 25/49]
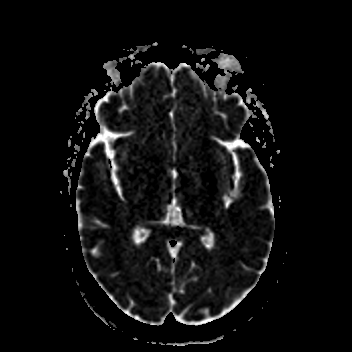
[im 49/49]
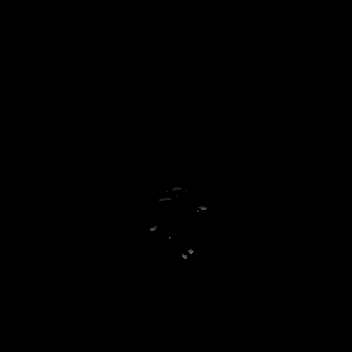

[Series 7: cor dwi_tracew · coronal · 5.0mm · 0.65mm/px · 3 of 42 slices shown]
[im 1/42]
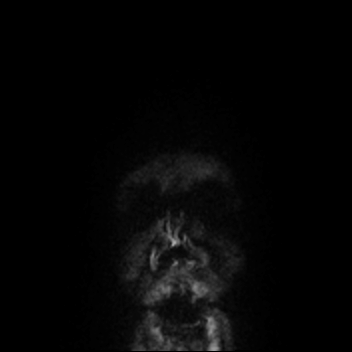
[im 21/42]
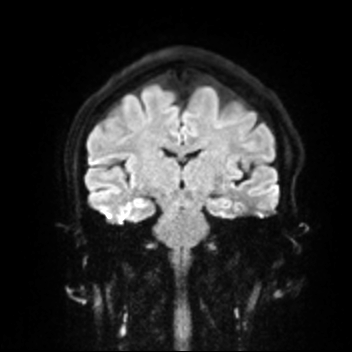
[im 42/42]
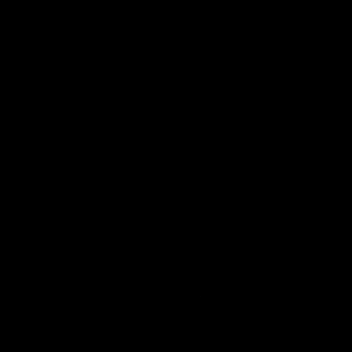

[Series 8: cor dwi_adc · coronal · 5.0mm · 0.65mm/px · 3 of 40 slices shown]
[im 1/40]
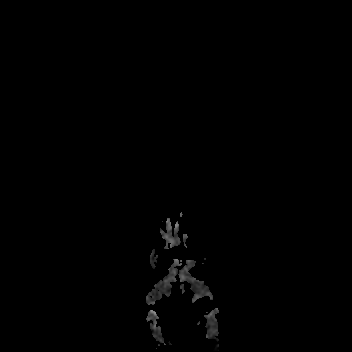
[im 20/40]
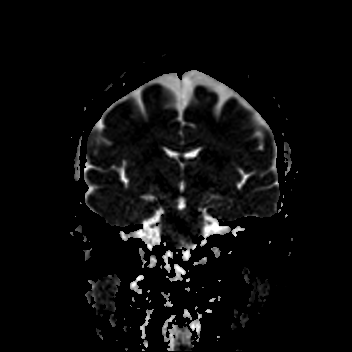
[im 40/40]
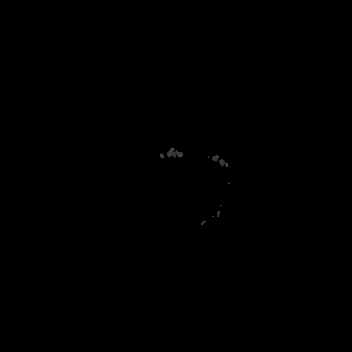

[Series 9: T1 · sagittal · 5.0mm · 0.62mm/px · 2 of 28 slices shown (1 of 2)]
[im 1/28]
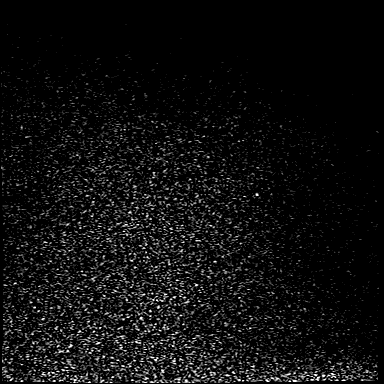
[im 28/28]
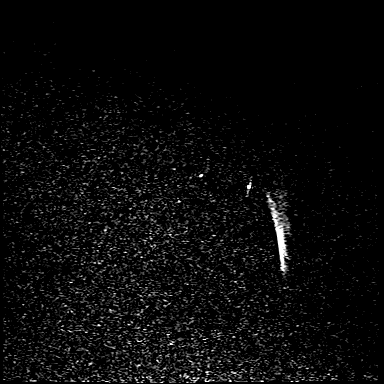

[Series 10: T2 · axial · 5.0mm · 0.53mm/px · z∈[-34,+134]mm · 2 of 29 slices shown (1 of 2)]
[im 1/29]
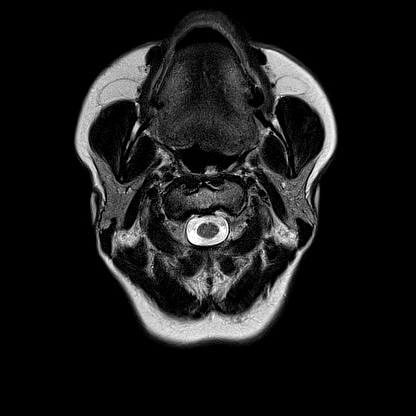
[im 29/29]
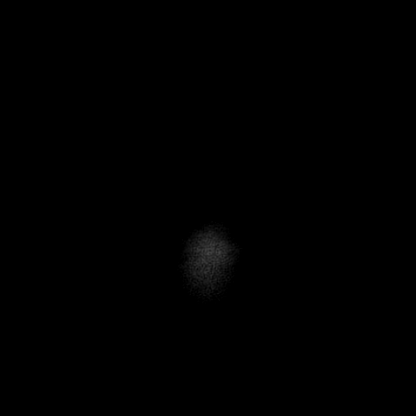

[Series 11: mag_images · axial · 3.0mm · 0.90mm/px · z∈[-38,+139]mm · 4 of 60 slices shown]
[im 1/60]
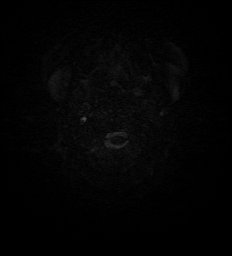
[im 20/60]
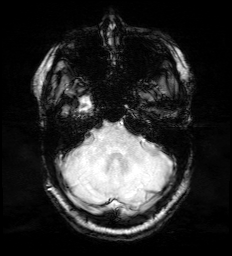
[im 40/60]
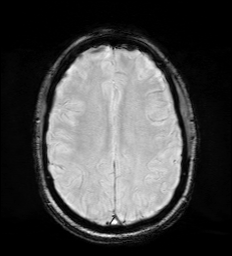
[im 60/60]
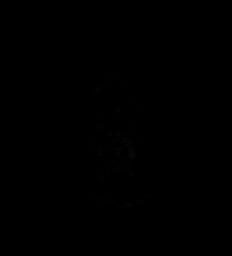

[Series 12: pha_images · axial · 3.0mm · 0.90mm/px · z∈[-38,+139]mm · 4 of 60 slices shown]
[im 1/60]
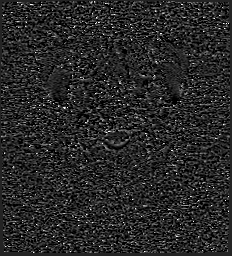
[im 20/60]
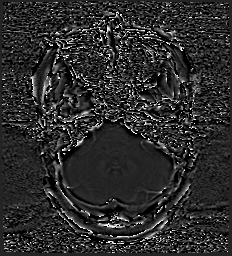
[im 40/60]
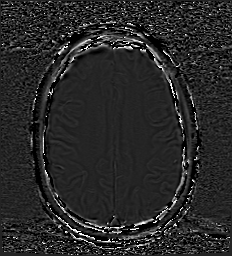
[im 60/60]
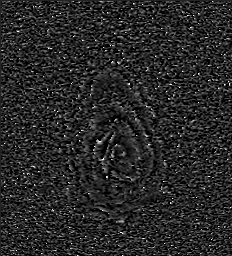

[Series 13: swi_images · axial · 3.0mm · 0.90mm/px · z∈[-38,+139]mm · 4 of 60 slices shown]
[im 1/60]
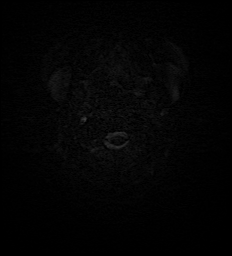
[im 20/60]
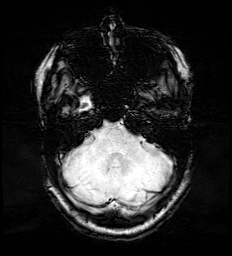
[im 40/60]
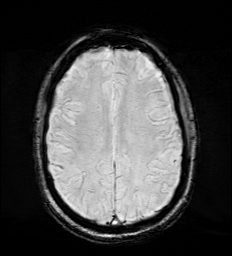
[im 60/60]
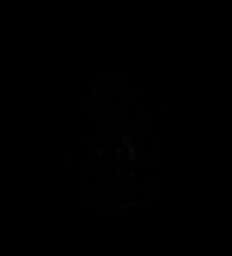

[Series 15: FLAIR · axial · 3.0mm · 0.53mm/px · z∈[-31,+131]mm · 4 of 55 slices shown]
[im 1/55]
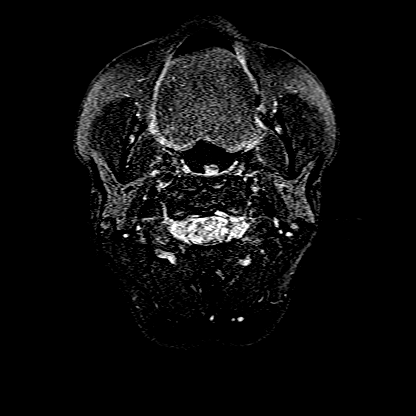
[im 19/55]
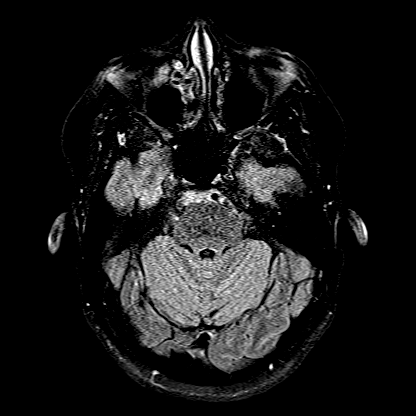
[im 37/55]
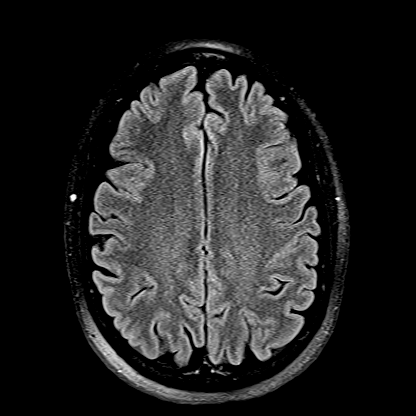
[im 55/55]
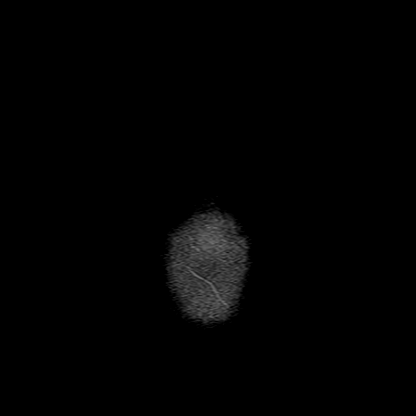

[Series 16: T1 · axial · 1.0mm · 0.98mm/px · z∈[-36,+138]mm · 12 of 176 slices shown (2 of 2)]
[im 1/176]
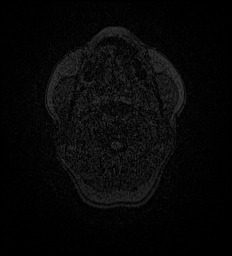
[im 16/176]
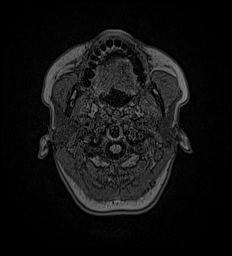
[im 32/176]
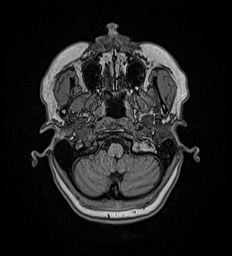
[im 48/176]
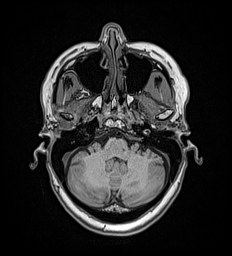
[im 64/176]
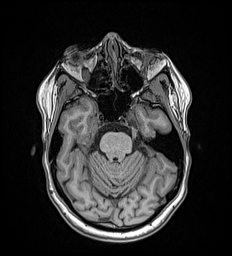
[im 80/176]
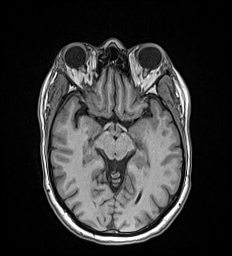
[im 96/176]
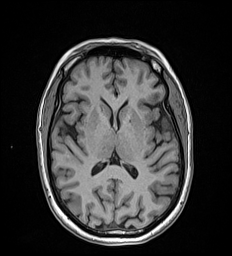
[im 112/176]
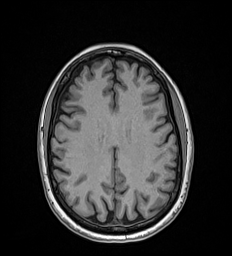
[im 128/176]
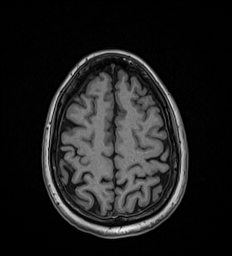
[im 144/176]
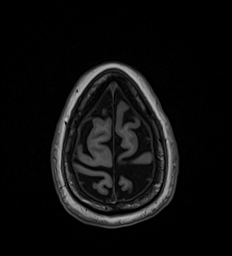
[im 160/176]
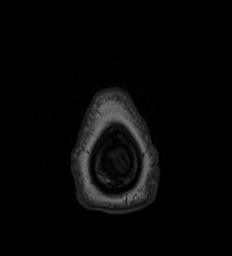
[im 176/176]
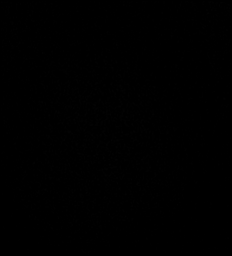

[Series 17: T2 · coronal · 5.0mm · 0.57mm/px · 2 of 33 slices shown (2 of 2)]
[im 1/33]
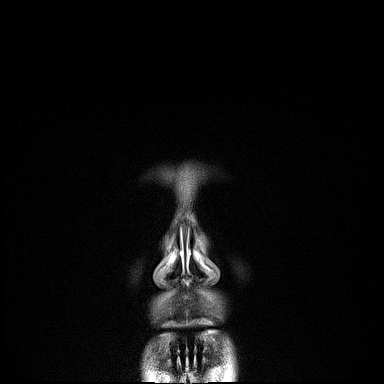
[im 33/33]
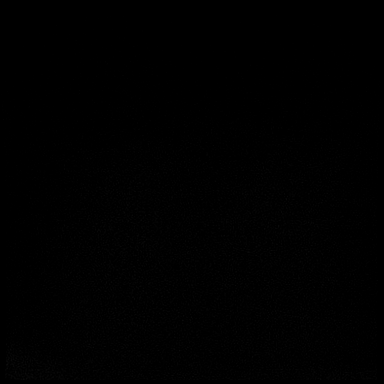

[48 of 48 positions shown; findings below may reference images not displayed]

FINDINGS: Brain: No acute infarction, hemorrhage, hydrocephalus, extra-axial
collection or mass lesion. 9 mm simple pineal cyst. Normal brain
volume and white matter appearance for age

Vascular: Normal flow voids, including vertebrobasilar.

Skull and upper cervical spine: Normal marrow signal

Sinuses/Orbits: Negative
IMPRESSION: Negative brain MRI.  No explanation for symptoms.

## 2021-02-10 MED ORDER — SCOPOLAMINE 1 MG/3DAYS TD PT72: 1.0000 | MEDICATED_PATCH | TRANSDERMAL | 0 refills | Status: AC

## 2021-02-10 MED ORDER — POTASSIUM CHLORIDE CRYS ER 20 MEQ PO TBCR
40.0000 meq | EXTENDED_RELEASE_TABLET | Freq: Once | ORAL | Status: AC
  Administered 2021-02-10: 40 meq via ORAL
  Filled 2021-02-10: qty 2

## 2021-02-10 NOTE — ED Notes (Signed)
Pt given warm blankets per request, aware of plan of care, call light in reach, on cardiac monitor. Spouse in recliner at bedside.   Denies further needs at this time.

## 2021-02-10 NOTE — ED Notes (Signed)
Report given to oncoming shift. All questions answered.  

## 2021-02-10 NOTE — ED Notes (Signed)
Pt OTF with MRI 

## 2021-02-10 NOTE — ED Provider Notes (Signed)
[facility] Emergency Department Provider Note   ____________________________________________   Event Date/Time   First MD Initiated Contact with Patient 02/10/21 0451     (approximate)  I have reviewed the triage vital signs and the nursing notes.   HISTORY  Chief Complaint Dizziness    HPI Monica Colon is a 60 y.o. female who presents to the ED from home with a chief complaint of dizziness.  Patient reports sudden onset dizziness for a few minutes on October 12.  Describes spinning sensation not associated with nausea or vomiting.  She did not have recurrent episode until last week.  She was seen at urgent care and prescribed meclizine for vertigo.  She had 3 episodes in the last few days and urgent care referred her to the ED for further work-up.  Denies associated headaches, vision changes, facial droop, extremity weakness/numbness/tingling or slurred speech.  Denies recent fever, cough, chest pain, shortness of breath, abdominal pain.      Past Medical History:  Diagnosis Date   Heart murmur    at age 65   Hyperlipidemia    Hypertension     Patient Active Problem List   Diagnosis Date Noted   Acute pericarditis 04/19/2014   Elevated sedimentation rate 04/19/2014   Chest pain 12/13/2012   Palpitations 01/15/2011   Left arm pain 01/15/2011   Mixed hyperlipidemia 01/15/2011   Essential hypertension 01/15/2011    Past Surgical History:  Procedure Laterality Date   COLONOSCOPY     TONSILLECTOMY     TRIGGER FINGER RELEASE Right    UPPER GI ENDOSCOPY      Prior to Admission medications   Medication Sig Start Date End Date Taking? Authorizing Provider  Biotin 5 MG TABS Take 1 tablet by mouth daily.    [provider]  cholecalciferol (VITAMIN D3) 25 MCG (1000 UNIT) tablet Take 5,000 Units by mouth daily.    [provider]  colchicine 0.6 MG tablet Take 1 tablet (0.6 mg total) by mouth in the morning and at  bedtime. 04/25/20   Marisue Ivan D, PA-C  ezetimibe-simvastatin (VYTORIN) 10-10 MG tablet Take 0.5 tablets by mouth daily. 10/04/18   Antonieta Iba, MD  fish oil-omega-3 fatty acids 1000 MG capsule Take 2 g by mouth daily.    [provider]  hydrochlorothiazide (MICROZIDE) 12.5 MG capsule Take 1 capsule (12.5 mg total) by mouth daily. 05/19/20   Antonieta Iba, MD  linaclotide (LINZESS) 290 MCG CAPS capsule Take 290 mcg by mouth daily. 10/12/19   [provider]  losartan (COZAAR) 50 MG tablet Take 2 tablets (100 mg total) by mouth daily. Patient only takes 50mg  daily most days 05/19/20   Antonieta Iba, MD  Multiple Vitamin (MULTIVITAMIN) capsule Take 1 capsule by mouth daily.    [provider]  omeprazole (PRILOSEC) 40 MG capsule Take 1 capsule (40 mg total) by mouth daily as needed. 04/25/20   Marisue Ivan D, PA-C  Probiotic Product (ALIGN) 4 MG CAPS Take 1 tablet by mouth daily.    [provider]  propranolol (INDERAL) 10 MG tablet Take 1 tablet (10 mg total) by mouth 3 (three) times daily as needed. 05/19/20 04/03/25  Antonieta Iba, MD    Allergies Penicillins  Family History  Problem Relation Age of Onset   Hyperlipidemia Mother    Hypertension Mother    Breast cancer Paternal Aunt     Social History Social History   Tobacco Use  Smoking status: Never   Smokeless tobacco: Never  Vaping Use   Vaping Use: Never used  Substance Use Topics   Alcohol use: Yes    Alcohol/week: 1.0 standard drink    Types: 1 Glasses of wine per week    Comment: social drinker.   Drug use: No    Review of Systems  Constitutional: No fever/chills Eyes: No visual changes. ENT: No sore throat. Cardiovascular: Denies chest pain. Respiratory: Denies shortness of breath. Gastrointestinal: No abdominal pain.  No nausea, no vomiting.  No diarrhea.  No constipation. Genitourinary: Negative for dysuria. Musculoskeletal: Negative for back  pain. Skin: Negative for rash. Neurological: Positive for dizziness.  Negative for headaches, focal weakness or numbness.   ____________________________________________   PHYSICAL EXAM:  VITAL SIGNS: ED Triage Vitals  Enc Vitals Group     BP 02/09/21 1955 (!) 163/96     Pulse Rate 02/09/21 1955 72     Resp 02/09/21 1955 18     Temp 02/09/21 1955 98.3 F (36.8 C)     Temp Source 02/09/21 1955 Oral     SpO2 02/09/21 1955 99 %     Weight 02/09/21 1959 150 lb (68 kg)     Height 02/09/21 1959 5\' 5"  (1.651 m)     Head Circumference --      Peak Flow --      Pain Score 02/09/21 1959 0     Pain Loc --      Pain Edu? --      Excl. in GC? --     Constitutional: Alert and oriented. Well appearing and in no acute distress. Eyes: Conjunctivae are normal. PERRL. EOMI. Head: Atraumatic. Nose: No congestion/rhinnorhea. Mouth/Throat: Mucous membranes are moist.   Neck: No stridor.  Supple neck without meningismus.  No carotid bruits. Cardiovascular: Normal rate, regular rhythm. Grossly normal heart sounds.  Good peripheral circulation. Respiratory: Normal respiratory effort.  No retractions. Lungs CTAB. Gastrointestinal: Soft and nontender. No distention. No abdominal bruits. No CVA tenderness. Musculoskeletal: No lower extremity tenderness nor edema.  No joint effusions. Neurologic: Alert and oriented x4.  CN II to XII grossly intact.  Normal speech and language. No gross focal neurologic deficits are appreciated. MAEx4. No gait instability. Skin:  Skin is warm, dry and intact. No rash noted. Psychiatric: Mood and affect are normal. Speech and behavior are normal.  ____________________________________________   LABS (all labs ordered are listed, but only abnormal results are displayed)  Labs Reviewed  BASIC METABOLIC PANEL - Abnormal; Notable for the following components:      Result Value   Potassium 3.0 (*)    All other components within normal limits  CBC  TROPONIN I (HIGH  SENSITIVITY)   ____________________________________________  EKG  ED ECG REPORT I, Quang Thorpe J, the attending physician, personally viewed and interpreted this ECG.   Date: 02/10/2021  EKG Time: 2001  Rate: 70  Rhythm: normal sinus rhythm  Axis: Normal  Intervals:none  ST&T Change: Nonspecific  ____________________________________________  RADIOLOGY I, Brenyn Petrey J, personally viewed and evaluated these images (plain radiographs) as part of my medical decision making, as well as reviewing the written report by the radiologist.  ED MD interpretation:  Pending  Official radiology report(s): No results found.  ____________________________________________   PROCEDURES  Procedure(s) performed (including Critical Care):  Procedures   ____________________________________________   INITIAL IMPRESSION / ASSESSMENT AND PLAN / ED COURSE  As part of my medical decision making, I reviewed the following data within the electronic MEDICAL RECORD NUMBER  History obtained from family, Nursing notes reviewed and incorporated, Labs reviewed, EKG interpreted, Old chart reviewed, Radiograph reviewed, and Notes from prior ED visits     60 year old female presenting with dizziness.  Differential diagnosis includes but is not limited to vertigo, volume depletion, TIA/CVA, ACS, infectious, metabolic etiology, etc.  Laboratory results demonstrate mild hypokalemia; will replete.  Check troponin, obtain orthostatic vital signs, MRI brain.  Will reassess.   ----------------------------------------- 6:49 AM on 02/10/2021 -----------------------------------------   Patient not orthostatic.  Awaiting MRI.  Care will be transferred to the oncoming provider at change of shift.  Anticipate discharge home if MRI is unremarkable.     ____________________________________________   FINAL CLINICAL IMPRESSION(S) / ED DIAGNOSES  Final diagnoses:  Dizziness  Hypokalemia     ED Discharge Orders      None        Note:  This document was prepared using Dragon voice recognition software and may include unintentional dictation errors.    Paulette Blanch, MD 02/10/21 [phone]

## 2021-02-10 NOTE — ED Notes (Signed)
Pt verbalized understanding of discharge instructions. Ambulated to lobby with significant other.

## 2021-02-10 NOTE — Discharge Instructions (Signed)
Your MRI is normal.  While it does not show a specific cause for your dizziness, this is reassuring.  Please follow up with Neurology and primary care for continued monitoring of your symptoms.

## 2021-02-12 ENCOUNTER — Other Ambulatory Visit: Payer: Self-pay

## 2021-02-12 ENCOUNTER — Emergency Department: Payer: BC Managed Care – PPO

## 2021-02-12 ENCOUNTER — Observation Stay
Admission: EM | Admit: 2021-02-12 | Discharge: 2021-02-15 | Disposition: A | Discharge status: Home / Self Care | Payer: BC Managed Care – PPO | Attending: Obstetrics and Gynecology | Admitting: Obstetrics and Gynecology

## 2021-02-12 DIAGNOSIS — I10 Essential (primary) hypertension: Secondary | ICD-10-CM | POA: Insufficient documentation

## 2021-02-12 DIAGNOSIS — Z20822 Contact with and (suspected) exposure to covid-19: Secondary | ICD-10-CM | POA: Diagnosis not present

## 2021-02-12 DIAGNOSIS — R011 Cardiac murmur, unspecified: Secondary | ICD-10-CM | POA: Insufficient documentation

## 2021-02-12 DIAGNOSIS — Z79899 Other long term (current) drug therapy: Secondary | ICD-10-CM | POA: Insufficient documentation

## 2021-02-12 DIAGNOSIS — R112 Nausea with vomiting, unspecified: Secondary | ICD-10-CM | POA: Diagnosis not present

## 2021-02-12 DIAGNOSIS — R42 Dizziness and giddiness: Secondary | ICD-10-CM

## 2021-02-12 DIAGNOSIS — H8110 Benign paroxysmal vertigo, unspecified ear: Secondary | ICD-10-CM | POA: Diagnosis not present

## 2021-02-12 DIAGNOSIS — H81399 Other peripheral vertigo, unspecified ear: Secondary | ICD-10-CM

## 2021-02-12 LAB — COMPREHENSIVE METABOLIC PANEL
ALT: 21 U/L (ref 0–44)
AST: 28 U/L (ref 15–41)
Albumin: 3.3 g/dL — ABNORMAL LOW (ref 3.5–5.0)
Alkaline Phosphatase: 47 U/L (ref 38–126)
Anion gap: 7 (ref 5–15)
BUN: 12 mg/dL (ref 6–20)
CO2: 26 mmol/L (ref 22–32)
Calcium: 8.4 mg/dL — ABNORMAL LOW (ref 8.9–10.3)
Chloride: 104 mmol/L (ref 98–111)
Creatinine, Ser: 0.79 mg/dL (ref 0.44–1.00)
GFR, Estimated: >60 mL/min (ref >60)
Glucose, Bld: 142 mg/dL — ABNORMAL HIGH (ref 70–99)
Potassium: 3.8 mmol/L (ref 3.5–5.1)
Sodium: 137 mmol/L (ref 135–145)
Total Bilirubin: 0.9 mg/dL (ref 0.3–1.2)
Total Protein: 6.4 g/dL — ABNORMAL LOW (ref 6.5–8.1)

## 2021-02-12 LAB — CBC WITH DIFFERENTIAL/PLATELET
Abs Immature Granulocytes: 0.03 10*3/uL (ref 0.00–0.07)
Basophils Absolute: 0 10*3/uL (ref 0.0–0.1)
Basophils Relative: 0 %
Eosinophils Absolute: 0 10*3/uL (ref 0.0–0.5)
Eosinophils Relative: 0 %
HCT: 39.1 % (ref 36.0–46.0)
Hemoglobin: 13.3 g/dL (ref 12.0–15.0)
Immature Granulocytes: 0 %
Lymphocytes Relative: 4 %
Lymphs Abs: 0.3 10*3/uL — ABNORMAL LOW (ref 0.7–4.0)
MCH: 32 pg (ref 26.0–34.0)
MCHC: 34 g/dL (ref 30.0–36.0)
MCV: 94.2 fL (ref 80.0–100.0)
Monocytes Absolute: 0.2 10*3/uL (ref 0.1–1.0)
Monocytes Relative: 3 %
Neutro Abs: 6.8 10*3/uL (ref 1.7–7.7)
Neutrophils Relative %: 93 %
Platelets: 288 10*3/uL (ref 150–400)
RBC: 4.15 MIL/uL (ref 3.87–5.11)
RDW: 11.9 % (ref 11.5–15.5)
WBC: 7.4 10*3/uL (ref 4.0–10.5)
nRBC: 0 % (ref 0.0–0.2)

## 2021-02-12 LAB — RESP PANEL BY RT-PCR (FLU A&B, COVID) ARPGX2
Influenza A by PCR: NEGATIVE
Influenza B by PCR: NEGATIVE
SARS Coronavirus 2 by RT PCR: NEGATIVE

## 2021-02-12 LAB — TROPONIN I (HIGH SENSITIVITY)
Troponin I (High Sensitivity): 8 ng/L (ref <18)
Troponin I (High Sensitivity): 8 ng/L (ref <18)

## 2021-02-12 IMAGING — CT CT HEAD W/O CM
3 series · 16 of 47 positions shown, 19 images · non-contrast
Comparison: MRI [DATE]

CLINICAL DATA: Dizziness, nonspecific. Symptoms over the last hour.
Similar symptoms 3 days ago.

EXAM:
CT HEAD WITHOUT CONTRAST
TECHNIQUE: Contiguous axial images were obtained from the base of the skull
through the vertex without intravenous contrast.

[Series 3: head wo · axial · 0.44mm/px · z∈[-120,+10]mm · 10 of 32 slices shown, 13 images]
[im 3/32  brain]
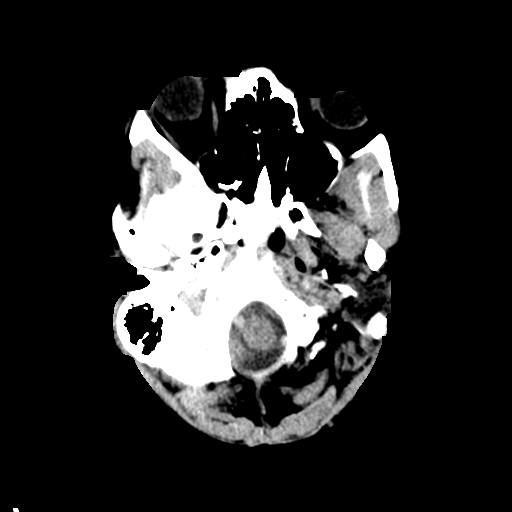
[im 3/32  bone]
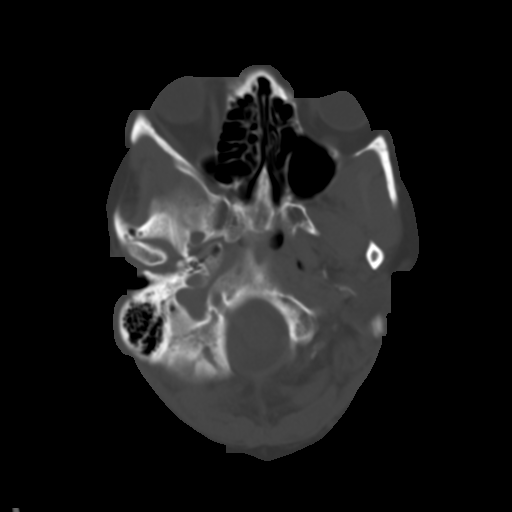
[im 6/32  brain]
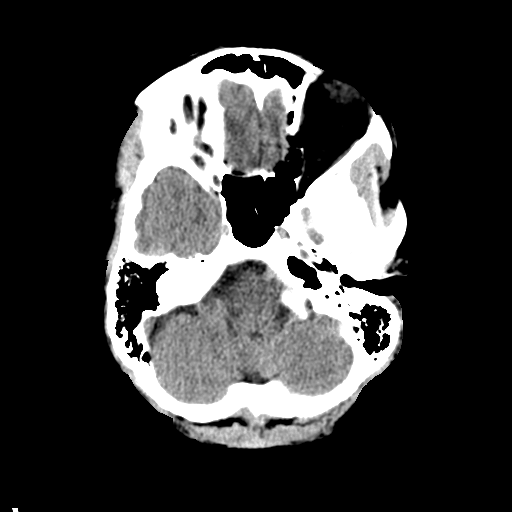
[im 9/32  brain]
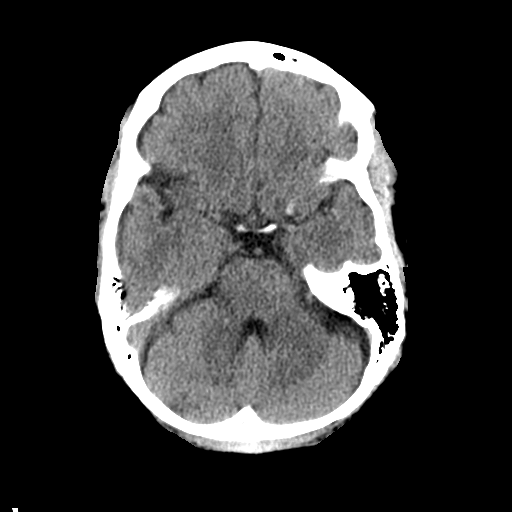
[im 11/32  brain]
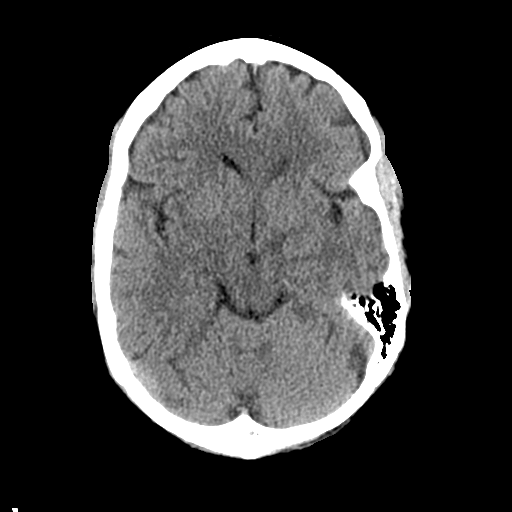
[im 14/32  brain]
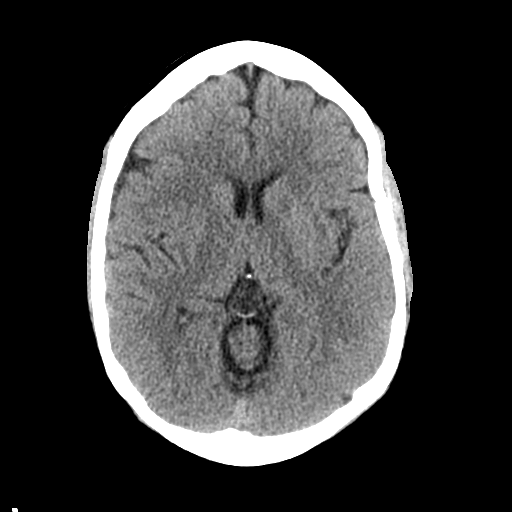
[im 14/32  bone]
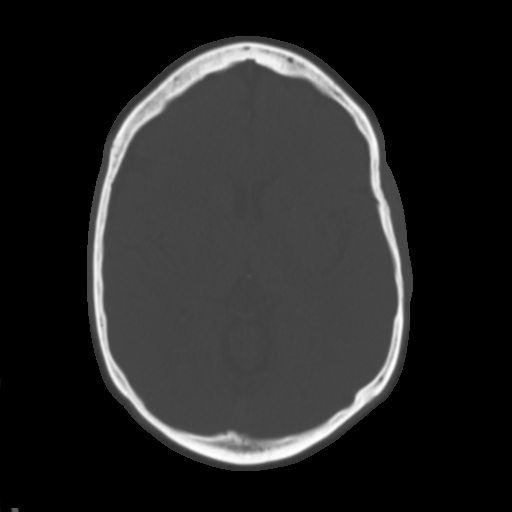
[im 18/32  brain]
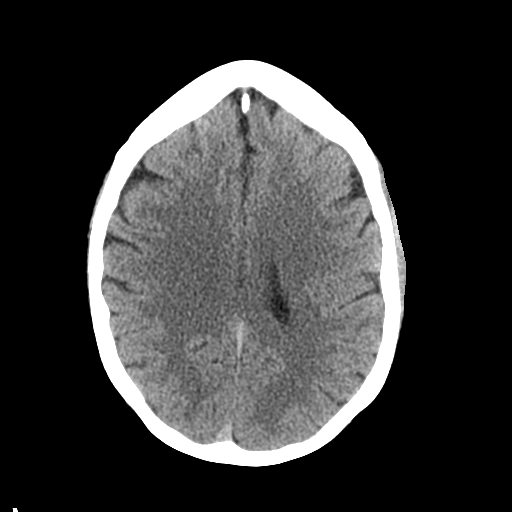
[im 21/32  brain]
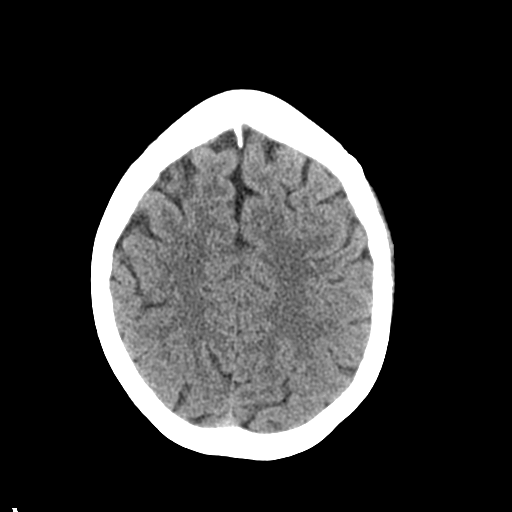
[im 24/32  brain]
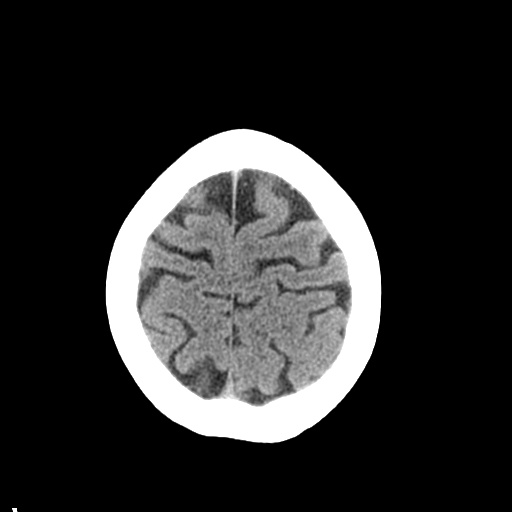
[im 26/32  brain]
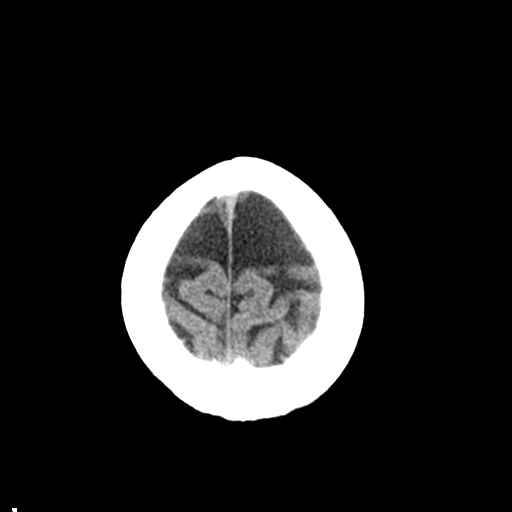
[im 26/32  bone]
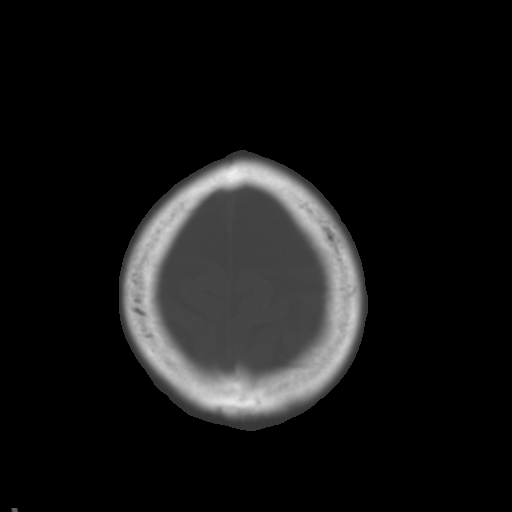
[im 29/32  brain]
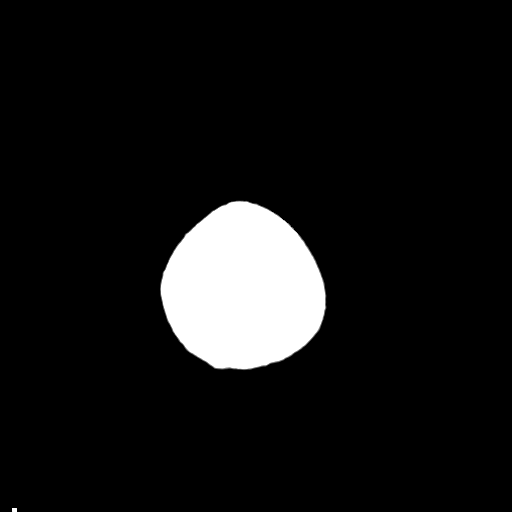

[Series 4: coronal soft tissue · coronal · 0.32mm/px · 3 of 68 slices shown]
[im 23/68  brain]
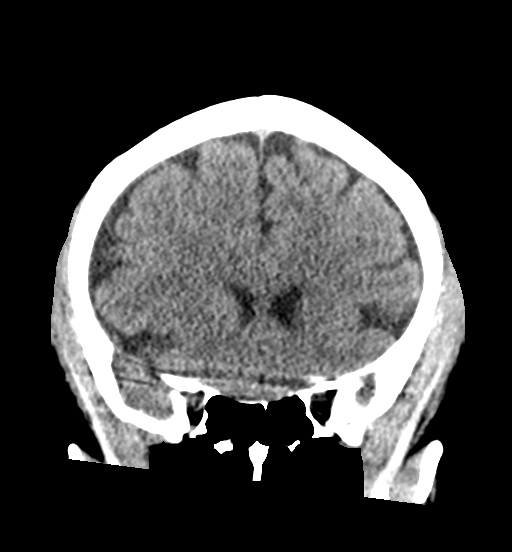
[im 30/68  brain]
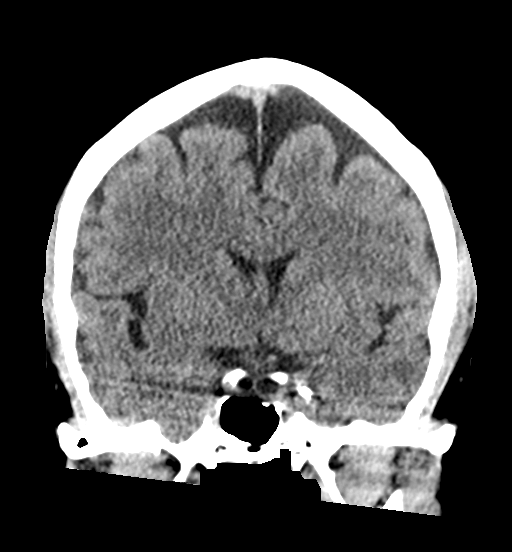
[im 38/68  brain]
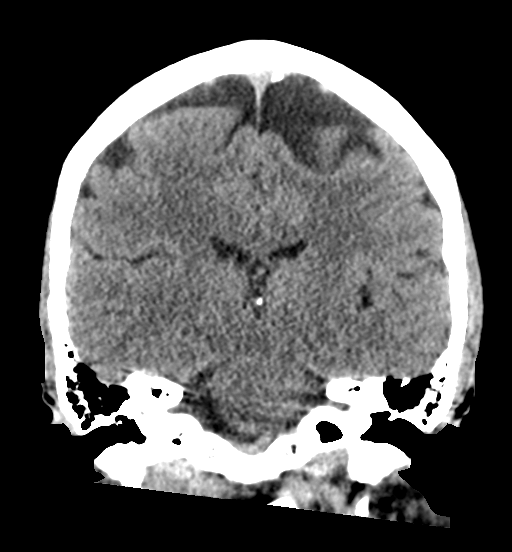

[Series 5: sagittal soft tissue · sagittal · 0.35mm/px · 3 of 56 slices shown]
[im 20/56  brain]
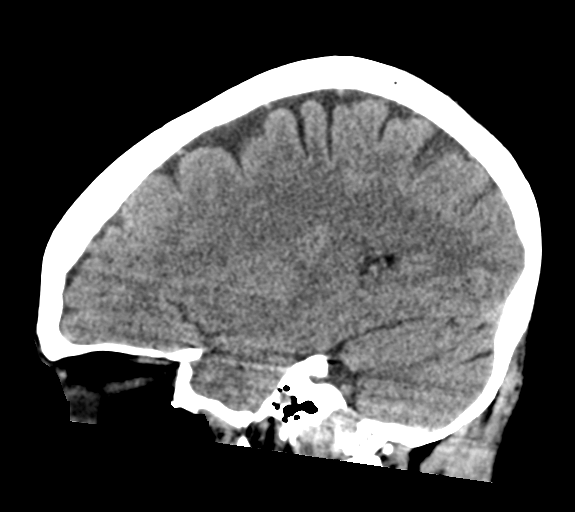
[im 28/56  brain]
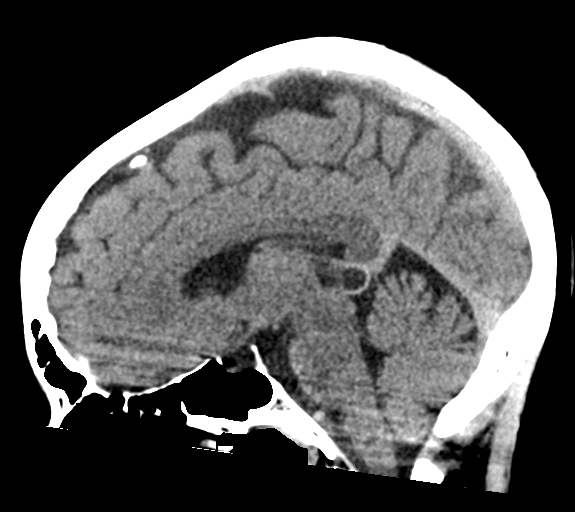
[im 37/56  brain]
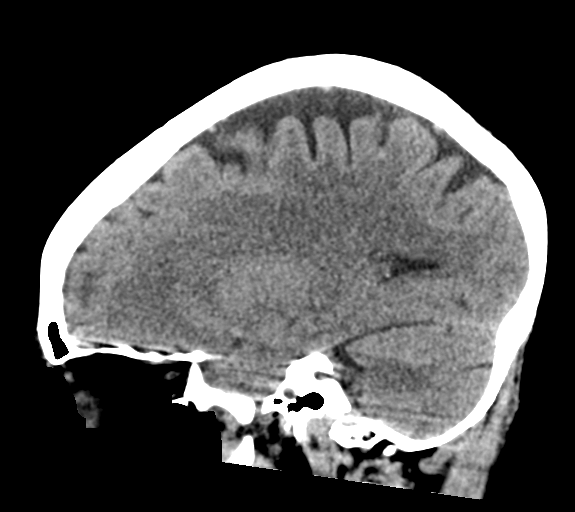

[16 of 47 positions shown; findings below may reference images not displayed]

FINDINGS: Brain: The brain shows a normal appearance without evidence of
malformation, atrophy, old or acute small or large vessel
infarction, mass lesion, hemorrhage, hydrocephalus or extra-axial
collection.

Vascular: No hyperdense vessel. No evidence of atherosclerotic
calcification.

Skull: Normal.  No traumatic finding.  No focal bone lesion.

Sinuses/Orbits: Sinuses are clear. Orbits appear normal. Mastoids
are clear.

Other: None significant
IMPRESSION: Normal head CT.

## 2021-02-12 MED ORDER — MECLIZINE HCL 25 MG PO TABS
25.0000 mg | ORAL_TABLET | Freq: Three times a day (TID) | ORAL | Status: DC
  Administered 2021-02-12 – 2021-02-15 (×8): 25 mg via ORAL
  Filled 2021-02-12 (×10): qty 1

## 2021-02-12 MED ORDER — DIAZEPAM 2 MG PO TABS: 2.0000 mg | ORAL_TABLET | Freq: Three times a day (TID) | ORAL | 0 refills | Status: DC | PRN

## 2021-02-12 MED ORDER — ACETAMINOPHEN 325 MG PO TABS
650.0000 mg | ORAL_TABLET | Freq: Four times a day (QID) | ORAL | Status: DC | PRN
  Administered 2021-02-12 – 2021-02-13 (×2): 650 mg via ORAL
  Filled 2021-02-12: qty 2

## 2021-02-12 MED ORDER — DIAZEPAM 5 MG PO TABS
5.0000 mg | ORAL_TABLET | Freq: Two times a day (BID) | ORAL | Status: DC
  Administered 2021-02-12 – 2021-02-15 (×6): 5 mg via ORAL
  Filled 2021-02-12 (×6): qty 1

## 2021-02-12 MED ORDER — LINACLOTIDE 290 MCG PO CAPS
290.0000 ug | ORAL_CAPSULE | Freq: Every day | ORAL | Status: DC
  Filled 2021-02-12 (×4): qty 1

## 2021-02-12 MED ORDER — HYDRALAZINE HCL 20 MG/ML IJ SOLN: 10.0000 mg | Freq: Four times a day (QID) | INTRAMUSCULAR | Status: DC | PRN

## 2021-02-12 MED ORDER — SODIUM CHLORIDE 0.9 % IV BOLUS
1000.0000 mL | Freq: Once | INTRAVENOUS | Status: AC
  Administered 2021-02-12: 1000 mL via INTRAVENOUS

## 2021-02-12 MED ORDER — LORAZEPAM 2 MG/ML IJ SOLN
1.0000 mg | Freq: Once | INTRAMUSCULAR | Status: AC
  Administered 2021-02-12: 1 mg via INTRAVENOUS
  Filled 2021-02-12: qty 1

## 2021-02-12 MED ORDER — SCOPOLAMINE 1 MG/3DAYS TD PT72
1.0000 | MEDICATED_PATCH | Freq: Once | TRANSDERMAL | Status: AC
  Administered 2021-02-12 – 2021-02-14 (×2): 1.5 mg via TRANSDERMAL
  Filled 2021-02-12: qty 1

## 2021-02-12 MED ORDER — ACETAMINOPHEN 650 MG RE SUPP
650.0000 mg | Freq: Four times a day (QID) | RECTAL | Status: DC | PRN
  Filled 2021-02-12: qty 1

## 2021-02-12 MED ORDER — ONDANSETRON HCL 4 MG/2ML IJ SOLN
4.0000 mg | Freq: Four times a day (QID) | INTRAMUSCULAR | Status: DC | PRN
  Administered 2021-02-12: 4 mg via INTRAVENOUS
  Filled 2021-02-12: qty 2

## 2021-02-12 MED ORDER — DEXAMETHASONE SODIUM PHOSPHATE 10 MG/ML IJ SOLN
10.0000 mg | Freq: Once | INTRAMUSCULAR | Status: AC
  Administered 2021-02-12: 10 mg via INTRAVENOUS
  Filled 2021-02-12: qty 1

## 2021-02-12 MED ORDER — SCOPOLAMINE 1 MG/3DAYS TD PT72
1.0000 | MEDICATED_PATCH | TRANSDERMAL | Status: DC
  Filled 2021-02-12 (×2): qty 1

## 2021-02-12 MED ORDER — ONDANSETRON HCL 4 MG PO TABS: 4.0000 mg | ORAL_TABLET | Freq: Four times a day (QID) | ORAL | Status: DC | PRN

## 2021-02-12 MED ORDER — SODIUM CHLORIDE 0.45 % IV SOLN: INTRAVENOUS | Status: DC

## 2021-02-12 MED ORDER — COLCHICINE 0.6 MG PO TABS
0.6000 mg | ORAL_TABLET | Freq: Two times a day (BID) | ORAL | Status: DC
  Administered 2021-02-12 – 2021-02-13 (×2): 0.6 mg via ORAL
  Filled 2021-02-12 (×2): qty 1

## 2021-02-12 MED ORDER — PANTOPRAZOLE SODIUM 40 MG IV SOLR: 40.0000 mg | INTRAVENOUS | Status: DC

## 2021-02-12 MED ORDER — METOCLOPRAMIDE HCL 5 MG/ML IJ SOLN
20.0000 mg | Freq: Once | INTRAVENOUS | Status: AC
  Administered 2021-02-12: 20 mg via INTRAVENOUS
  Filled 2021-02-12: qty 4

## 2021-02-12 MED ORDER — KETOROLAC TROMETHAMINE 30 MG/ML IJ SOLN
30.0000 mg | Freq: Once | INTRAMUSCULAR | Status: AC
  Administered 2021-02-12: 30 mg via INTRAVENOUS
  Filled 2021-02-12: qty 1

## 2021-02-12 MED ORDER — POTASSIUM CHLORIDE 10 MEQ/100ML IV SOLN
10.0000 meq | Freq: Once | INTRAVENOUS | Status: AC
  Administered 2021-02-12: 10 meq via INTRAVENOUS
  Filled 2021-02-12: qty 100

## 2021-02-12 NOTE — ED Notes (Signed)
Pt given warm blanket.

## 2021-02-12 NOTE — ED Triage Notes (Signed)
Pt comes via [facility] with reports of vertigo for an hour. EMS states she was seen for the same thing on Monday. Pt reports not taking her medication for vertigo this morning. BP for EMS was 204/81

## 2021-02-12 NOTE — ED Notes (Signed)
Informed RN bed assigned 

## 2021-02-12 NOTE — ED Notes (Signed)
Pt wet themselves. Pt was cleaned up and brief was put on

## 2021-02-12 NOTE — ED Notes (Signed)
Purewick placed at this time.

## 2021-02-12 NOTE — ED Notes (Addendum)
Patient transported to CT 

## 2021-02-12 NOTE — Plan of Care (Signed)
Patient admitted in to room 203. Complains of nausea,no vomiting observed. Spouse by bedside. Oriented to room and call bell. Vitals done.

## 2021-02-12 NOTE — H&P (Signed)
History and Physical    Monica Colon [id number] DOB: November 21, 1960 DOA: 02/12/2021  PCP: Gustavo Lah, MD   Patient coming from: Home  I have personally briefly reviewed patient's old medical records in [place]  Chief Complaint: Dizziness  HPI: Monica Colon is a 60 y.o. female with medical history significant for hypertension and dyslipidemia who presents to the emergency room for the second time in 1 week for evaluation of persistent dizziness. Patient states that her symptoms started about a month ago and she has had symptoms intermittently since then.  She was seen at an urgent care center and prescribed meclizine which she took as prescribed.  Symptoms occurred 2 days prior to this admission and she describes it as feeling like she is spinning in the room.  She was seen in the ER and had an MRI of the brain done which was unremarkable.  She was discharged home but returns today due to persistent dizziness now associated with nausea and multiple episodes of emesis.  Any form of movement makes her symptoms worse.  She denies having any headache, no blurred vision, no tinnitus, no hearing loss, no extremity weakness, no numbness, no tingling, no slurred speech, no lightheadedness, no leg swelling, no chest pain, no shortness of breath, no palpitations, no diaphoresis, no urinary symptoms, no fever, no chills, no changes in her bowel habits.  Labs show sodium 137, potassium 3.8, chloride 104, bicarb 26, glucose 142, BUN 12, creatinine 0.79, calcium 8.4, alkaline phosphatase 47, albumin 3.3, AST 28, ALT 21, total protein 6.4, troponin 8, white count 7.4, hemoglobin 13.3, hematocrit 39.1, MCV 94.2, RDW 11.9, platelet count 288 CT scan of the head without contrast shows no acute findings. MRI of the brain shows no evidence of acute infarction, no hemorrhage, no hydrocephalus, no mass lesion or extra-axial collection. Twelve-lead EKG reviewed by me shows sinus rhythm.   Borderline T wave abnormalities in the anterior leads.    ED Course: Patient is a 60 year old female with a history of hypertension and dyslipidemia who presents to the ER for evaluation of refractory vertigo. She received scopolamine patch, Reglan, Ativan and Decadron in the ER but continues to have emesis and dizziness with any form of movement. She will be referred to observation status for further evaluation.  Review of Systems: As per HPI otherwise all other systems reviewed and negative.    Past Medical History:  Diagnosis Date   Heart murmur    at age 1   Hyperlipidemia    Hypertension     Past Surgical History:  Procedure Laterality Date   COLONOSCOPY     TONSILLECTOMY     TRIGGER FINGER RELEASE Right    UPPER GI ENDOSCOPY       reports that she has never smoked. She has never used smokeless tobacco. She reports current alcohol use of about 1.0 standard drink per week. She reports that she does not use drugs.  Allergies  Allergen Reactions   Penicillins     Has patient had a PCN reaction causing immediate rash, facial/tongue/throat swelling, SOB or lightheadedness with hypotension: Yes Has patient had a PCN reaction causing severe rash involving mucus membranes or skin necrosis: No Has patient had a PCN reaction that required hospitalization: No Has patient had a PCN reaction occurring within the last 10 years: No If all of the above answers are "NO", then may proceed with Cephalosporin use.    Family History  Problem Relation Age of Onset  Hyperlipidemia Mother    Hypertension Mother    Breast cancer Paternal Aunt       Prior to Admission medications   Medication Sig Start Date End Date Taking? Authorizing Provider  diazepam (VALIUM) 2 MG tablet Take 1 tablet (2 mg total) by mouth every 8 (eight) hours as needed for up to 14 days (dizziness). 02/12/21 02/26/21 Yes Naaman Plummer, MD  Biotin 5 MG TABS Take 1 tablet by mouth daily.    [provider]  cholecalciferol (VITAMIN D3) 25 MCG (1000 UNIT) tablet Take 5,000 Units by mouth daily.    [provider]  colchicine 0.6 MG tablet Take 1 tablet (0.6 mg total) by mouth in the morning and at bedtime. 04/25/20   Marrianne Mood D, PA-C  ezetimibe-simvastatin (VYTORIN) 10-10 MG tablet Take 0.5 tablets by mouth daily. 10/04/18   Minna Merritts, MD  fish oil-omega-3 fatty acids 1000 MG capsule Take 2 g by mouth daily.    [provider]  hydrochlorothiazide (MICROZIDE) 12.5 MG capsule Take 1 capsule (12.5 mg total) by mouth daily. 05/19/20   Minna Merritts, MD  linaclotide (LINZESS) 290 MCG CAPS capsule Take 290 mcg by mouth daily. 10/12/19   [provider]  losartan (COZAAR) 50 MG tablet Take 2 tablets (100 mg total) by mouth daily. Patient only takes 50mg  daily most days 05/19/20   Minna Merritts, MD  Multiple Vitamin (MULTIVITAMIN) capsule Take 1 capsule by mouth daily.    [provider]  omeprazole (PRILOSEC) 40 MG capsule Take 1 capsule (40 mg total) by mouth daily as needed. 04/25/20   Marrianne Mood D, PA-C  Probiotic Product (ALIGN) 4 MG CAPS Take 1 tablet by mouth daily.    [provider]  propranolol (INDERAL) 10 MG tablet Take 1 tablet (10 mg total) by mouth 3 (three) times daily as needed. 05/19/20 04/03/25  Minna Merritts, MD  scopolamine (TRANSDERM-SCOP, 1.5 MG,) 1 MG/3DAYS Place 1 patch (1.5 mg total) onto the skin every 3 (three) days. 02/10/21 03/12/21  Carrie Mew, MD    Physical Exam: Vitals:   02/12/21 0829 02/12/21 0830 02/12/21 0930  BP: (!) 174/145 (!) 163/86 (!) 156/105  Pulse: 68 71 95  Resp: (!) 31 (!) 29 17  Temp: 97.9 F (36.6 C)    TempSrc: Oral    SpO2: 98% 99% 99%  Weight:  68 kg   Height:  5\' 5"  (1.651 m)      Vitals:   02/12/21 0829 02/12/21 0830 02/12/21 0930  BP: (!) 174/145 (!) 163/86 (!) 156/105  Pulse: 68 71 95  Resp: (!) 31 (!) 29 17  Temp: 97.9 F (36.6 C)    TempSrc: Oral     SpO2: 98% 99% 99%  Weight:  68 kg   Height:  5\' 5"  (1.651 m)       Constitutional: Alert and oriented x 3 . Not in any apparent distress HEENT:      Head: Normocephalic and atraumatic.         Eyes: PERLA, EOMI, Conjunctivae are normal. Sclera is non-icteric.       Mouth/Throat: Mucous membranes are moist.       Neck: Supple with no signs of meningismus. Cardiovascular: Regular rate and rhythm. No murmurs, gallops, or rubs. 2+ symmetrical distal pulses are present . No JVD. No LE edema Respiratory: Respiratory effort normal .Lungs sounds clear bilaterally. No wheezes, crackles, or rhonchi.  Gastrointestinal: Soft, non tender, and non distended with positive  bowel sounds.  Genitourinary: No CVA tenderness. Musculoskeletal: Nontender with normal range of motion in all extremities. No cyanosis, or erythema of extremities. Neurologic:  Face is symmetric. Moving all extremities. No gross focal neurologic deficits . Skin: Skin is warm, dry.  No rash or ulcers Psychiatric: Mood and affect are normal    Labs on Admission: I have personally reviewed following labs and imaging studies  CBC: Recent Labs  Lab 02/09/21 2002 02/12/21 1225  WBC 6.2 7.4  NEUTROABS  --  6.8  HGB 14.5 13.3  HCT 42.9 39.1  MCV 94.9 94.2  PLT 331 288   Basic Metabolic Panel: Recent Labs  Lab 02/09/21 2002 02/12/21 1225  NA 139 137  K 3.0* 3.8  CL 101 104  CO2 31 26  GLUCOSE 99 142*  BUN 14 12  CREATININE 0.81 0.79  CALCIUM 9.5 8.4*   GFR: Estimated Creatinine Clearance: 67.3 mL/min (by C-G formula based on SCr of 0.79 mg/dL). Liver Function Tests: Recent Labs  Lab 02/12/21 1225  AST 28  ALT 21  ALKPHOS 47  BILITOT 0.9  PROT 6.4*  ALBUMIN 3.3*   No results for input(s): LIPASE, AMYLASE in the last 168 hours. No results for input(s): AMMONIA in the last 168 hours. Coagulation Profile: No results for input(s): INR, PROTIME in the last 168 hours. Cardiac Enzymes: No results for  input(s): CKTOTAL, CKMB, CKMBINDEX, TROPONINI in the last 168 hours. BNP (last 3 results) No results for input(s): PROBNP in the last 8760 hours. HbA1C: No results for input(s): HGBA1C in the last 72 hours. CBG: No results for input(s): GLUCAP in the last 168 hours. Lipid Profile: No results for input(s): CHOL, HDL, LDLCALC, TRIG, CHOLHDL, LDLDIRECT in the last 72 hours. Thyroid Function Tests: No results for input(s): TSH, T4TOTAL, FREET4, T3FREE, THYROIDAB in the last 72 hours. Anemia Panel: No results for input(s): VITAMINB12, FOLATE, FERRITIN, TIBC, IRON, RETICCTPCT in the last 72 hours. Urine analysis: No results found for: COLORURINE, APPEARANCEUR, LABSPEC, PHURINE, GLUCOSEU, HGBUR, BILIRUBINUR, KETONESUR, PROTEINUR, UROBILINOGEN, NITRITE, LEUKOCYTESUR  Radiological Exams on Admission: CT Head Wo Contrast  Result Date: 02/12/2021 CLINICAL DATA:  Dizziness, nonspecific. Symptoms over the last hour. Similar symptoms 3 days ago. EXAM: CT HEAD WITHOUT CONTRAST TECHNIQUE: Contiguous axial images were obtained from the base of the skull through the vertex without intravenous contrast. COMPARISON:  MRI 02/10/2021 FINDINGS: Brain: The brain shows a normal appearance without evidence of malformation, atrophy, old or acute small or large vessel infarction, mass lesion, hemorrhage, hydrocephalus or extra-axial collection. Vascular: No hyperdense vessel. No evidence of atherosclerotic calcification. Skull: Normal.  No traumatic finding.  No focal bone lesion. Sinuses/Orbits: Sinuses are clear. Orbits appear normal. Mastoids are clear. Other: None significant IMPRESSION: Normal head CT. Electronically Signed   By: Paulina Fusi M.D.   On: 02/12/2021 10:07     Assessment/Plan Principal Problem:   BPV (benign positional vertigo) Active Problems:   Essential hypertension     Patient is a 61 year old female who presents to the emergency room for evaluation of refractory vertigo.   BPV Continue  meclizine 25 mg 3 times daily We will start patient on diazepam 5 mg twice daily Supportive care with IV fluid hydration, antiemetics and IV PPI   Hypertension Will place patient on IV hydralazine since she is unable to take oral medication  DVT prophylaxis: SCD  Code Status: full code  Family Communication: Greater than 50% of time was spent discussing patient's condition and plan with her husband at the  bedside.  All questions and concerns have been addressed.  He verbalizes understanding and agrees with the plan. Disposition Plan: Back to previous home environment Consults called: none  Status:Observation    Keven Osborn MD [facility]     02/12/2021, 3:08 PM

## 2021-02-12 NOTE — ED Provider Notes (Signed)
[facility] Emergency Department Provider Note   ____________________________________________   Event Date/Time   First MD Initiated Contact with Patient 02/12/21 [phone]     (approximate)  I have reviewed the triage vital signs and the nursing notes.   HISTORY  Chief Complaint Dizziness    HPI Monica Colon is a 60 y.o. female who presents for vertigo and emesis  LOCATION: Generalized DURATION: 1 week prior to arrival TIMING: Intermittent and worsening since onset SEVERITY: Severe QUALITY: Vertigo with emesis CONTEXT: Patient states that she was recently seen in urgent care approximately 1 week prior to arrival for similar symptoms of vertigo.  Patient was initially prescribed meclizine with minimal reduction in symptoms and patient was seen here on 02/10/21 where she received an MRI showing no evidence of acute abnormalities and discharged on scopolamine patches which she has never picked up MODIFYING FACTORS: Any movement worsens the symptoms and they are partially relieved at rest and with meclizine ASSOCIATED SYMPTOMS: Emesis/nausea   Per medical record review patient has history of hypertension and hyperlipidemia          Past Medical History:  Diagnosis Date   Heart murmur    at age 43   Hyperlipidemia    Hypertension     Patient Active Problem List   Diagnosis Date Noted   Acute pericarditis 04/19/2014   Elevated sedimentation rate 04/19/2014   Chest pain 12/13/2012   Palpitations 01/15/2011   Left arm pain 01/15/2011   Mixed hyperlipidemia 01/15/2011   Essential hypertension 01/15/2011    Past Surgical History:  Procedure Laterality Date   COLONOSCOPY     TONSILLECTOMY     TRIGGER FINGER RELEASE Right    UPPER GI ENDOSCOPY      Prior to Admission medications   Medication Sig Start Date End Date Taking? Authorizing Provider  diazepam (VALIUM) 2 MG tablet Take 1 tablet (2 mg total) by mouth every 8 (eight) hours as  needed for up to 14 days (dizziness). 02/12/21 02/26/21 Yes Merwyn Katos, MD  Biotin 5 MG TABS Take 1 tablet by mouth daily.    [provider]  cholecalciferol (VITAMIN D3) 25 MCG (1000 UNIT) tablet Take 5,000 Units by mouth daily.    [provider]  colchicine 0.6 MG tablet Take 1 tablet (0.6 mg total) by mouth in the morning and at bedtime. 04/25/20   Marisue Ivan D, PA-C  ezetimibe-simvastatin (VYTORIN) 10-10 MG tablet Take 0.5 tablets by mouth daily. 10/04/18   Antonieta Iba, MD  fish oil-omega-3 fatty acids 1000 MG capsule Take 2 g by mouth daily.    [provider]  hydrochlorothiazide (MICROZIDE) 12.5 MG capsule Take 1 capsule (12.5 mg total) by mouth daily. 05/19/20   Antonieta Iba, MD  linaclotide (LINZESS) 290 MCG CAPS capsule Take 290 mcg by mouth daily. 10/12/19   [provider]  losartan (COZAAR) 50 MG tablet Take 2 tablets (100 mg total) by mouth daily. Patient only takes 50mg  daily most days 05/19/20   05/21/20, MD  Multiple Vitamin (MULTIVITAMIN) capsule Take 1 capsule by mouth daily.    [provider]  omeprazole (PRILOSEC) 40 MG capsule Take 1 capsule (40 mg total) by mouth daily as needed. 04/25/20   04/27/20 D, PA-C  Probiotic Product (ALIGN) 4 MG CAPS Take 1 tablet by mouth daily.    [provider]  propranolol (INDERAL) 10 MG tablet Take 1 tablet (10 mg total) by mouth 3 (three)  times daily as needed. 05/19/20 04/03/25  Minna Merritts, MD  scopolamine (TRANSDERM-SCOP, 1.5 MG,) 1 MG/3DAYS Place 1 patch (1.5 mg total) onto the skin every 3 (three) days. 02/10/21 03/12/21  Carrie Mew, MD    Allergies Penicillins  Family History  Problem Relation Age of Onset   Hyperlipidemia Mother    Hypertension Mother    Breast cancer Paternal Aunt     Social History Social History   Tobacco Use   Smoking status: Never   Smokeless tobacco: Never  Vaping Use   Vaping Use: Never used   Substance Use Topics   Alcohol use: Yes    Alcohol/week: 1.0 standard drink    Types: 1 Glasses of wine per week    Comment: social drinker.   Drug use: No    Review of Systems Constitutional: No fever/chills Eyes: No visual changes. ENT: No sore throat. Cardiovascular: Denies chest pain. Respiratory: Denies shortness of breath. Gastrointestinal: No abdominal pain.  Endorses nausea and vomiting.  No diarrhea. Genitourinary: Negative for dysuria. Musculoskeletal: Negative for acute arthralgias Skin: Negative for rash. Neurological: Endorses vertigo.  Negative for headaches, weakness/numbness/paresthesias in any extremity Psychiatric: Negative for suicidal ideation/homicidal ideation   ____________________________________________   PHYSICAL EXAM:  VITAL SIGNS: ED Triage Vitals  Enc Vitals Group     BP      Pulse      Resp      Temp      Temp src      SpO2      Weight      Height      Head Circumference      Peak Flow      Pain Score      Pain Loc      Pain Edu?      Excl. in [place]?    Constitutional: Alert and oriented. Well appearing and in no acute distress. Eyes: Conjunctivae are normal. PERRL. Head: Atraumatic. Nose: No congestion/rhinnorhea. Mouth/Throat: Mucous membranes are moist. Neck: No stridor Cardiovascular: Grossly normal heart sounds.  Good peripheral circulation. Respiratory: Normal respiratory effort.  No retractions. Gastrointestinal: Soft and nontender. No distention. Musculoskeletal: No obvious deformities Neurologic:  Normal speech and language. No gross focal neurologic deficits are appreciated. Skin:  Skin is warm and dry. No rash noted. Psychiatric: Mood and affect are withdrawn. Speech and behavior are uncooperative  ____________________________________________   LABS (all labs ordered are listed, but only abnormal results are displayed)  Labs Reviewed  COMPREHENSIVE METABOLIC PANEL - Abnormal; Notable for the following components:       Result Value   Glucose, Bld 142 (*)    Calcium 8.4 (*)    Total Protein 6.4 (*)    Albumin 3.3 (*)    All other components within normal limits  CBC WITH DIFFERENTIAL/PLATELET - Abnormal; Notable for the following components:   Lymphs Abs 0.3 (*)    All other components within normal limits  TROPONIN I (HIGH SENSITIVITY)  TROPONIN I (HIGH SENSITIVITY)   ____________________________________________  EKG  ED ECG REPORT I, Naaman Plummer, the attending physician, personally viewed and interpreted this ECG.  Date: 02/12/2021 EKG Time: 0828 Rate: 68 Rhythm: normal sinus rhythm QRS Axis: normal Intervals: normal ST/T Wave abnormalities: normal Narrative Interpretation: no evidence of acute ischemia  ____________________________________________  RADIOLOGY  ED MD interpretation: CT of the head without contrast shows no evidence of acute abnormalities including no intracerebral hemorrhage, obvious masses, or significant edema  Official radiology report(s): CT Head Wo Contrast  Result Date: 02/12/2021 CLINICAL DATA:  Dizziness, nonspecific. Symptoms over the last hour. Similar symptoms 3 days ago. EXAM: CT HEAD WITHOUT CONTRAST TECHNIQUE: Contiguous axial images were obtained from the base of the skull through the vertex without intravenous contrast. COMPARISON:  MRI 02/10/2021 FINDINGS: Brain: The brain shows a normal appearance without evidence of malformation, atrophy, old or acute small or large vessel infarction, mass lesion, hemorrhage, hydrocephalus or extra-axial collection. Vascular: No hyperdense vessel. No evidence of atherosclerotic calcification. Skull: Normal.  No traumatic finding.  No focal bone lesion. Sinuses/Orbits: Sinuses are clear. Orbits appear normal. Mastoids are clear. Other: None significant IMPRESSION: Normal head CT. Electronically Signed   By: Nelson Chimes M.D.   On: 02/12/2021 10:07     ____________________________________________   PROCEDURES  Procedure(s) performed (including Critical Care):  .1-3 Lead EKG Interpretation Performed by: Naaman Plummer, MD Authorized by: Naaman Plummer, MD     Interpretation: normal     ECG rate:  71   ECG rate assessment: normal     Rhythm: sinus rhythm     Ectopy: none     Conduction: normal     ____________________________________________   INITIAL IMPRESSION / ASSESSMENT AND PLAN / ED COURSE  As part of my medical decision making, I reviewed the following data within the electronic medical record, if available:  Nursing notes reviewed and incorporated, Labs reviewed, EKG interpreted, Old chart reviewed, Radiograph reviewed and Notes from prior ED visits reviewed and incorporated        Patient is a 60 year old female who presents for symptoms of vertigo, nausea, and vomiting with associated headache DDx: Posterior CVA, posterior TIA, migraine, BPPV, labyrinthitis Work-up: CBC, CMP, troponin with no evidence of acute abnormalities Repeat head CT shows no evidence of acute abnormalities. Despite treatment with migraine cocktail, Ativan, fluids, and electrolyte replacement, patient still vomiting and with vertiginous symptoms  Given these persistent symptoms, patient will require admission to the internal medicine service for further evaluation and management  Dispo: Admit to medicine      ____________________________________________   FINAL CLINICAL IMPRESSION(S) / ED DIAGNOSES  Final diagnoses:  Dizziness  Vertigo  Peripheral vertigo, unspecified laterality  Nausea and vomiting, unspecified vomiting type     ED Discharge Orders          Ordered    diazepam (VALIUM) 2 MG tablet  Every 8 hours PRN        02/12/21 1345             Note:  This document was prepared using Dragon voice recognition software and may include unintentional dictation errors.    Naaman Plummer, MD 02/12/21  1540

## 2021-02-13 ENCOUNTER — Other Ambulatory Visit: Payer: Self-pay | Admitting: Obstetrics and Gynecology

## 2021-02-13 DIAGNOSIS — H8110 Benign paroxysmal vertigo, unspecified ear: Secondary | ICD-10-CM | POA: Diagnosis not present

## 2021-02-13 LAB — CBC
HCT: 38.6 % (ref 36.0–46.0)
Hemoglobin: 13 g/dL (ref 12.0–15.0)
MCH: 31.5 pg (ref 26.0–34.0)
MCHC: 33.7 g/dL (ref 30.0–36.0)
MCV: 93.5 fL (ref 80.0–100.0)
Platelets: 342 10*3/uL (ref 150–400)
RBC: 4.13 MIL/uL (ref 3.87–5.11)
RDW: 12.1 % (ref 11.5–15.5)
WBC: 9.2 10*3/uL (ref 4.0–10.5)
nRBC: 0 % (ref 0.0–0.2)

## 2021-02-13 LAB — BASIC METABOLIC PANEL
Anion gap: 7 (ref 5–15)
BUN: 12 mg/dL (ref 6–20)
CO2: 23 mmol/L (ref 22–32)
Calcium: 8.5 mg/dL — ABNORMAL LOW (ref 8.9–10.3)
Chloride: 105 mmol/L (ref 98–111)
Creatinine, Ser: 0.64 mg/dL (ref 0.44–1.00)
GFR, Estimated: >60 mL/min (ref >60)
Glucose, Bld: 103 mg/dL — ABNORMAL HIGH (ref 70–99)
Potassium: 3.6 mmol/L (ref 3.5–5.1)
Sodium: 135 mmol/L (ref 135–145)

## 2021-02-13 LAB — HIV ANTIBODY (ROUTINE TESTING W REFLEX): HIV Screen 4th Generation wRfx: NONREACTIVE

## 2021-02-13 MED ORDER — HYDROCHLOROTHIAZIDE 12.5 MG PO TABS
12.5000 mg | ORAL_TABLET | Freq: Every day | ORAL | Status: DC
  Administered 2021-02-13 – 2021-02-15 (×3): 12.5 mg via ORAL
  Filled 2021-02-13 (×4): qty 1

## 2021-02-13 MED ORDER — EZETIMIBE-SIMVASTATIN 10-10 MG PO TABS: 0.5000 | ORAL_TABLET | Freq: Every day | ORAL | Status: DC

## 2021-02-13 MED ORDER — IBUPROFEN 400 MG PO TABS
600.0000 mg | ORAL_TABLET | Freq: Four times a day (QID) | ORAL | Status: DC | PRN
  Administered 2021-02-13 – 2021-02-15 (×4): 600 mg via ORAL
  Filled 2021-02-13 (×4): qty 2

## 2021-02-13 MED ORDER — PANTOPRAZOLE SODIUM 40 MG PO TBEC: 40.0000 mg | DELAYED_RELEASE_TABLET | Freq: Every day | ORAL | Status: DC

## 2021-02-13 MED ORDER — EZETIMIBE 10 MG PO TABS
5.0000 mg | ORAL_TABLET | Freq: Every day | ORAL | Status: DC
  Administered 2021-02-13 – 2021-02-15 (×3): 5 mg via ORAL
  Filled 2021-02-13 (×3): qty 0.5

## 2021-02-13 MED ORDER — PREDNISONE 20 MG PO TABS
60.0000 mg | ORAL_TABLET | Freq: Every day | ORAL | Status: DC
  Administered 2021-02-13 – 2021-02-15 (×3): 60 mg via ORAL
  Filled 2021-02-13 (×3): qty 3

## 2021-02-13 MED ORDER — SIMVASTATIN 5 MG PO TABS
5.0000 mg | ORAL_TABLET | Freq: Every day | ORAL | Status: DC
  Administered 2021-02-13 – 2021-02-15 (×3): 5 mg via ORAL
  Filled 2021-02-13 (×3): qty 1

## 2021-02-13 MED ORDER — PANTOPRAZOLE SODIUM 40 MG PO TBEC
40.0000 mg | DELAYED_RELEASE_TABLET | Freq: Every day | ORAL | Status: DC
  Administered 2021-02-13 – 2021-02-15 (×3): 40 mg via ORAL
  Filled 2021-02-13 (×3): qty 1

## 2021-02-13 MED ORDER — SCOPOLAMINE 1 MG/3DAYS TD PT72
1.0000 | MEDICATED_PATCH | TRANSDERMAL | Status: DC
Start: 2021-02-15 — End: 2021-02-13

## 2021-02-13 MED ORDER — LOSARTAN POTASSIUM 50 MG PO TABS
100.0000 mg | ORAL_TABLET | Freq: Every day | ORAL | Status: DC
  Administered 2021-02-13 – 2021-02-15 (×3): 100 mg via ORAL
  Filled 2021-02-13 (×3): qty 2

## 2021-02-13 NOTE — Evaluation (Signed)
Physical Therapy Evaluation Patient Details Name: Monica Colon MRN: [id number] DOB: 1960-08-03 Today's Date: 02/13/2021  History of Present Illness  Monica Colon is a 60yoF who [facility] on 11/10 after 1 hour vertigo, BP 204/43mmHg. Pt here on previously on 11/7 for same issue CC episodic vertigo that started on 01/14/21 inittially with a few episodes then episode free for 2 weeks before starting again, then after a week of recurrence, pt seen at urgent care and prescribed Meclinzine.  Clinical Impression  Pt admitted with above diagnosis. Pt currently with functional limitations due to the deficits listed below (see "PT Problem List"). Upon entry, pt in bed, drowsy but awake, is agreeable to participate. Pt reports her vertigo seems worse compared to previous day, has had continuous vertigo since 2300 previous night. The pt is alert, pleasant, interactive, and able to provide info regarding prior level of function, both in tolerance and independence. Husband is at bedside.   Oculomotor exam: pt has resting, continuous Rt beating lateral nystagmus, did not advance to screening smooth pursuits. Dix Hallpike Test: Negative for posterior canal BPPV bilat, pt has improved dizziness with testing Rt, worse symptoms with testing left; no up-beating torsional nystagmus demonstrated; with Left testing pt has Left lateral beating nystagmus (a directional change) Finger to nose testing: minimal target inaccuracy with LUE only Distal target touch: WNL, no frank impairment Epley Maneuver: performed for Left sided BPPV (not indicated by testing but as requested by provider); no change in resting symptoms, no post-testing change with Left dix-hallpike  Patient's performance this date reveals continued nystagmus and vertigo, no clear improvement with administration of meclizine/valium. 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 Pt has decreased ability, independence, and tolerance in performing all basic mobility required for performance of activities of daily living. Pt requires additional time, medications, and intermittent  physical assistance for safe participate in mobility. Pt will benefit from skilled PT intervention to increase independence and safety with basic mobility in preparation for discharge to the venue listed below.        Recommendations for follow up therapy are one component of a multi-disciplinary discharge planning process, led by the attending physician.  Recommendations may be updated based on patient status, additional functional criteria and insurance authorization.  Follow Up Recommendations Outpatient PT (outpatient vestibular therapy [facility]; ENT FU outpatient)    Assistance Recommended at Discharge Set up Supervision/Assistance  Functional Status Assessment Patient has had a recent decline in their functional status and demonstrates the ability to make significant improvements in function in a reasonable and predictable amount of time.  Equipment Recommendations  None recommended by PT    Recommendations for Other Services       Precautions / Restrictions Precautions Precautions: Fall Restrictions Weight Bearing Restrictions: No      Mobility  Bed Mobility Overal bed mobility: Modified Independent             General bed mobility comments: supine to sitting/sitting to supine, somewaht labored. dizziness unchanged    Transfers Overall transfer level: Needs assistance Equipment used: 2 person hand held assist Transfers: Sit to/from Stand Sit to Stand: Min guard           General transfer comment: no frank asymmetry in legs, no changes in dizziness    Ambulation/Gait Ambulation/Gait assistance:  (deferred due to ongoing  vertigo)                Stairs            Wheelchair Mobility    Modified Rankin (Stroke Patients Only)       Balance                                             Pertinent Vitals/Pain Pain Assessment: No/denies pain    Home Living Family/patient expects to be discharged to:: Private residence Living Arrangements: Spouse/significant other Available Help at Discharge: Family Type of Home: House Home Access: Stairs to enter Entrance Stairs-Rails: Right Entrance Stairs-Number of Steps: 4 Alternate Level Stairs-Number of Steps: 13 stairs Home Layout: Two level;Bed/bath upstairs Home Equipment: None      Prior Function                       Hand Dominance        Extremity/Trunk Assessment   Upper Extremity Assessment Upper Extremity Assessment: Generalized weakness;LUE deficits/detail LUE Deficits / Details: mild to minimal target inaccruacy with Left IF to nose.    Lower Extremity Assessment Lower Extremity Assessment: Generalized weakness       Communication      Cognition Arousal/Alertness: Awake/alert Behavior During Therapy: WFL for tasks assessed/performed Overall Cognitive Status: Within Functional Limits for tasks assessed                                          General Comments      Exercises     Assessment/Plan    PT Assessment Patient needs continued PT services  PT Problem List Decreased strength;Decreased range of motion;Decreased activity tolerance;Decreased balance;Decreased mobility       PT Treatment Interventions Gait training;Stair training;Functional mobility training;Therapeutic activities;Therapeutic exercise;Patient/family education;Neuromuscular re-education    PT Goals (Current goals can be found in the Care Plan section)  Acute Rehab PT Goals Patient Stated Goal: return to baseline mobility tolerance PT Goal Formulation: With patient Time For Goal Achievement:  02/27/21 Potential to Achieve Goals: Good    Frequency Min 2X/week   Barriers to discharge        Co-evaluation               AM-PAC PT "6 Clicks" Mobility  Outcome Measure Help needed turning from your back to your side while in a flat bed without using bedrails?: A Little Help needed moving from lying on your back to sitting on the side of a flat bed without using bedrails?: A Little Help needed moving to and from a bed to a chair (including a wheelchair)?: A Little Help needed standing up from a chair using your arms (e.g., wheelchair or bedside chair)?: A Little Help needed to walk in hospital room?: A Lot Help needed climbing 3-5 steps with a railing? : A Lot 6 Click Score: 16    End of Session   Activity Tolerance: Patient tolerated treatment well Patient left: in bed;with family/visitor present;with call bell/phone within reach Nurse Communication: Mobility status (IV detach; asked to hold PO meds due to potential for emesis during Epley maneurver.) PT Visit Diagnosis: Unsteadiness on feet (R26.81);Difficulty in walking, not elsewhere classified (R26.2);Other symptoms and signs involving the nervous system (R29.898);Dizziness and giddiness (  R42)    Time: 6948-5462 PT Time Calculation (min) (ACUTE ONLY): 20 min   Charges:   PT Evaluation $PT Eval Low Complexity: 1 Low PT Treatments $Neuromuscular Re-education: 8-22 mins      2:35 PM, 02/13/21 Rosamaria Lints, PT, DPT Physical Therapist - [facility]  [phone] (ASCOM)    Manaal Mandala C 02/13/2021, 2:18 PM

## 2021-02-13 NOTE — TOC Initial Note (Signed)
Transition of Care [facility]) - Initial/Assessment Note    Patient Details  Name: Monica Colon MRN: [id number] Date of Birth: 10-Sep-1960  Transition of Care [facility]) CM/SW Contact:    Candie Chroman, LCSW Phone Number: 02/13/2021, 3:55 PM  Clinical Narrative:  CSW met with patient. No supports at bedside. CSW introduced role and explained that PT recommendations would be discussed. Patient is agreeable to outpatient vestibular therapy. Prefers center here at [facility]. Referral form on chart for MD to sign. Will ask weekend [facility] team member to fax once signed. No further concerns. CSW encouraged patient to contact CSW as needed. CSW will continue to follow patient for support and facilitate return home when stable.                Expected Discharge Plan: OP Rehab Barriers to Discharge: Continued Medical Work up   Patient Goals and CMS Choice     Choice offered to / list presented to : Patient  Expected Discharge Plan and Services Expected Discharge Plan: OP Rehab     Post Acute Care Choice:  (Outpatient vestibular therapy) Living arrangements for the past 2 months: Single Family Home                                      Prior Living Arrangements/Services Living arrangements for the past 2 months: Single Family Home Lives with:: Spouse Patient language and need for interpreter reviewed:: Yes Do you feel safe going back to the place where you live?: Yes      Need for Family Participation in Patient Care: Yes (Comment) Care giver support system in place?: Yes (comment)   Criminal Activity/Legal Involvement Pertinent to Current Situation/Hospitalization: No - Comment as needed  Activities of Daily Living Home Assistive Devices/Equipment: None ADL Screening (condition at time of admission) Patient's cognitive ability adequate to safely complete daily activities?: Yes Is the patient deaf or have difficulty hearing?: No Does the patient have difficulty seeing, even when  wearing glasses/contacts?: No Does the patient have difficulty concentrating, remembering, or making decisions?: No Patient able to express need for assistance with ADLs?: Yes Does the patient have difficulty dressing or bathing?: No Independently performs ADLs?: Yes (appropriate for developmental age) Does the patient have difficulty walking or climbing stairs?: No Weakness of Legs: None Weakness of Arms/Hands: None  Permission Sought/Granted Permission sought to share information with : Facility Art therapist granted to share information with : Yes, Verbal Permission Granted     Permission granted to share info w AGENCY: [facility]        Emotional Assessment Appearance:: Appears stated age Attitude/Demeanor/Rapport: Engaged, Gracious Affect (typically observed): Accepting, Appropriate, Calm, Pleasant Orientation: : Oriented to Self, Oriented to Place, Oriented to  Time, Oriented to Situation Alcohol / Substance Use: Not Applicable Psych Involvement: No (comment)  Admission diagnosis:  Dizziness [R42] Vertigo [R42] Peripheral vertigo, unspecified laterality [H81.399] BPV (benign positional vertigo) [H81.10] Nausea and vomiting, unspecified vomiting type [R11.2] Patient Active Problem List   Diagnosis Date Noted   BPV (benign positional vertigo) 02/12/2021   Acute pericarditis 04/19/2014   Elevated sedimentation rate 04/19/2014   Chest pain 12/13/2012   Esophageal reflux 07/02/2012   Palpitations 01/15/2011   Left arm pain 01/15/2011   Mixed hyperlipidemia 01/15/2011   Essential hypertension 01/15/2011   PCP:  Gustavo Lah, MD Pharmacy:   [facility] [place], [place] -  [address] [place] [address] Phone: [phone] Fax: [phone]     Social Determinants of Health ([place]) Interventions    Readmission Risk  Interventions No flowsheet data found.

## 2021-02-13 NOTE — Plan of Care (Signed)
  Problem: Education: Goal: Knowledge of General Education information will improve Description: Including pain rating scale, medication(s)/side effects and non-pharmacologic comfort measures Outcome: Progressing   Problem: Health Behavior/Discharge Planning: Goal: Ability to manage health-related needs will improve Outcome: Progressing   Problem: Clinical Measurements: Goal: Ability to maintain clinical measurements within normal limits will improve Outcome: Progressing Goal: Will remain free from infection Outcome: Progressing Goal: Diagnostic test results will improve Outcome: Progressing Goal: Respiratory complications will improve Outcome: Progressing Goal: Cardiovascular complication will be avoided Outcome: Progressing   Problem: Coping: Goal: Level of anxiety will decrease Outcome: Progressing   Problem: Nutrition: Goal: Adequate nutrition will be maintained Outcome: Progressing   Problem: Activity: Goal: Risk for activity intolerance will decrease Outcome: Progressing   Problem: Pain Managment: Goal: General experience of comfort will improve Outcome: Progressing   Problem: Safety: Goal: Ability to remain free from injury will improve Outcome: Progressing   Problem: Skin Integrity: Goal: Risk for impaired skin integrity will decrease Outcome: Progressing

## 2021-02-13 NOTE — Progress Notes (Addendum)
PROGRESS NOTE    Monica Colon  YQM:[id number] DOB: 1960-04-17 DOA: 02/12/2021 PCP: Lynwood Dawley, MD  Outpatient Specialists: cardiology    Brief Narrative:   From admission h and p Monica Colon is a 60 y.o. female with medical history significant for hypertension and dyslipidemia who presents to the emergency room for the second time in 1 week for evaluation of persistent dizziness. Patient states that her symptoms started about a month ago and she has had symptoms intermittently since then.  She was seen at an urgent care center and prescribed meclizine which she took as prescribed.  Symptoms occurred 2 days prior to this admission and she describes it as feeling like she is spinning in the room.  She was seen in the ER and had an MRI of the brain done which was unremarkable.  She was discharged home but returns today due to persistent dizziness now associated with nausea and multiple episodes of emesis.  Any form of movement makes her symptoms worse.  She denies having any headache, no blurred vision, no tinnitus, no hearing loss, no extremity weakness, no numbness, no tingling, no slurred speech, no lightheadedness, no leg swelling, no chest pain, no shortness of breath, no palpitations, no diaphoresis, no urinary symptoms, no fever, no chills, no changes in her bowel habits.    Assessment & Plan:   Principal Problem:   BPV (benign positional vertigo) Active Problems:   Essential hypertension  # Vertigo Severe. MRI neg. No hearing loss or tinnitus to suggest meniere. Initially episodic and associated w/ movement leading me to think BPPV, but today complaining of nearly constant vertigo. Had PT perform epley maneuvers, no reported improvement in symptoms after. Possible other etiologies like vestibular neuritis, vestibular migraine on ddx - continue valium, meclizine, zofran - will ask ENT to eval, may be worth trial of steroids for vestibular neuritis  #  HTN Here bp mildly elevated - cont home hctz, vytorin, losartan   DVT prophylaxis: lovenox Code Status: full Family Communication:  husband updated @ bedside 11/11  Level of care: Telemetry Medical Status is: Observation  The patient will require care spanning > 2 midnights and should be moved to inpatient because: acuity of illness       Consultants:  none  Procedures: none  Antimicrobials:  none    Subjective: Mild vertigo when still, worse with any head movement  Objective: Vitals:   02/12/21 1706 02/12/21 2101 02/13/21 0452 02/13/21 0730  BP: 135/65 (!) 143/70 138/63 (!) 145/66  Pulse: 69 72 69 (!) 58  Resp: 17 20 20 18   Temp: 97.7 F (36.5 C) 98.1 F (36.7 C) 98 F (36.7 C) 98.4 F (36.9 C)  TempSrc: Oral Oral Oral   SpO2: 99% 99% 98% 100%  Weight:      Height:        Intake/Output Summary (Last 24 hours) at 02/13/2021 1033 Last data filed at 02/13/2021 0729 Gross per 24 hour  Intake 1160.11 ml  Output --  Net 1160.11 ml   Filed Weights   02/12/21 0830  Weight: 68 kg    Examination:  General exam: Appears calm and comfortable  Respiratory system: Clear to auscultation. Respiratory effort normal. Cardiovascular system: S1 & S2 heard, RRR. No JVD, murmurs, rubs, gallops or clicks. No pedal edema. Gastrointestinal system: Abdomen is nondistended, soft and nontender. No organomegaly or masses felt. Normal bowel sounds heard. Central nervous system: Alert and oriented. No focal neurological deficits. Extremities: Symmetric 5 x 5 power.  Skin: No rashes, lesions or ulcers Psychiatry: Judgement and insight appear normal. Mood & affect appropriate.     Data Reviewed: I have personally reviewed following labs and imaging studies  CBC: Recent Labs  Lab 02/09/21 2002 02/12/21 1225 02/13/21 0415  WBC 6.2 7.4 9.2  NEUTROABS  --  6.8  --   HGB 14.5 13.3 13.0  HCT 42.9 39.1 38.6  MCV 94.9 94.2 93.5  PLT 331 288 342   Basic Metabolic  Panel: Recent Labs  Lab 02/09/21 2002 02/12/21 1225 02/13/21 0415  NA 139 137 135  K 3.0* 3.8 3.6  CL 101 104 105  CO2 31 26 23   GLUCOSE 99 142* 103*  BUN 14 12 12   CREATININE 0.81 0.79 0.64  CALCIUM 9.5 8.4* 8.5*   GFR: Estimated Creatinine Clearance: 67.3 mL/min (by C-G formula based on SCr of 0.64 mg/dL). Liver Function Tests: Recent Labs  Lab 02/12/21 1225  AST 28  ALT 21  ALKPHOS 47  BILITOT 0.9  PROT 6.4*  ALBUMIN 3.3*   No results for input(s): LIPASE, AMYLASE in the last 168 hours. No results for input(s): AMMONIA in the last 168 hours. Coagulation Profile: No results for input(s): INR, PROTIME in the last 168 hours. Cardiac Enzymes: No results for input(s): CKTOTAL, CKMB, CKMBINDEX, TROPONINI in the last 168 hours. BNP (last 3 results) No results for input(s): PROBNP in the last 8760 hours. HbA1C: No results for input(s): HGBA1C in the last 72 hours. CBG: No results for input(s): GLUCAP in the last 168 hours. Lipid Profile: No results for input(s): CHOL, HDL, LDLCALC, TRIG, CHOLHDL, LDLDIRECT in the last 72 hours. Thyroid Function Tests: No results for input(s): TSH, T4TOTAL, FREET4, T3FREE, THYROIDAB in the last 72 hours. Anemia Panel: No results for input(s): VITAMINB12, FOLATE, FERRITIN, TIBC, IRON, RETICCTPCT in the last 72 hours. Urine analysis: No results found for: COLORURINE, APPEARANCEUR, LABSPEC, PHURINE, GLUCOSEU, HGBUR, BILIRUBINUR, KETONESUR, PROTEINUR, UROBILINOGEN, NITRITE, LEUKOCYTESUR Sepsis Labs: @LABRCNTIP (procalcitonin:4,lacticidven:4)  ) Recent Results (from the past 240 hour(s))  Resp Panel by RT-PCR (Flu A&B, Covid) Nasopharyngeal Swab     Status: None   Collection Time: 02/12/21  2:58 PM   Specimen: Nasopharyngeal Swab; Nasopharyngeal(NP) swabs in vial transport medium  Result Value Ref Range Status   SARS Coronavirus 2 by RT PCR NEGATIVE NEGATIVE Final    Comment: (NOTE) SARS-CoV-2 target nucleic acids are NOT  DETECTED.  The SARS-CoV-2 RNA is generally detectable in upper respiratory specimens during the acute phase of infection. The lowest concentration of SARS-CoV-2 viral copies this assay can detect is 138 copies/mL. A negative result does not preclude SARS-Cov-2 infection and should not be used as the sole basis for treatment or other patient management decisions. A negative result may occur with  improper specimen collection/handling, submission of specimen other than nasopharyngeal swab, presence of viral mutation(s) within the areas targeted by this assay, and inadequate number of viral copies(<138 copies/mL). A negative result must be combined with clinical observations, patient history, and epidemiological information. The expected result is Negative.  Fact Sheet for Patients:  13/10/22  Fact Sheet for Healthcare Providers:   This test is no t yet approved or cleared by the 13/10/22 FDA and  has been authorized for detection and/or diagnosis of SARS-CoV-2 by FDA under an Emergency Use Authorization (EUA). This EUA will remain  in effect (meaning this test can be used) for the duration of the COVID-19 declaration under Section 564(b)(1) of the Act, 21 U.S.C.section 360bbb-3(b)(1), unless the authorization  is terminated  or revoked sooner.       Influenza A by PCR NEGATIVE NEGATIVE Final   Influenza B by PCR NEGATIVE NEGATIVE Final    Comment: (NOTE) The Xpert Xpress SARS-CoV-2/FLU/RSV plus assay is intended as an aid in the diagnosis of influenza from Nasopharyngeal swab specimens and should not be used as a sole basis for treatment. Nasal washings and aspirates are unacceptable for Xpert Xpress SARS-CoV-2/FLU/RSV testing.  Fact Sheet for Patients: BloggerCourse.com  Fact Sheet for Healthcare Providers: SeriousBroker.it  This test is not yet  approved or cleared by the [place] FDA and has been authorized for detection and/or diagnosis of SARS-CoV-2 by FDA under an Emergency Use Authorization (EUA). This EUA will remain in effect (meaning this test can be used) for the duration of the COVID-19 declaration under Section 564(b)(1) of the Act, 21 U.S.C. section 360bbb-3(b)(1), unless the authorization is terminated or revoked.  Performed at [facility], [address], [place], [place] [address]          Radiology Studies: CT Head Wo Contrast  Result Date: 02/12/2021 CLINICAL DATA:  Dizziness, nonspecific. Symptoms over the last hour. Similar symptoms 3 days ago. EXAM: CT HEAD WITHOUT CONTRAST TECHNIQUE: Contiguous axial images were obtained from the base of the skull through the vertex without intravenous contrast. COMPARISON:  MRI 02/10/2021 FINDINGS: Brain: The brain shows a normal appearance without evidence of malformation, atrophy, old or acute small or large vessel infarction, mass lesion, hemorrhage, hydrocephalus or extra-axial collection. Vascular: No hyperdense vessel. No evidence of atherosclerotic calcification. Skull: Normal.  No traumatic finding.  No focal bone lesion. Sinuses/Orbits: Sinuses are clear. Orbits appear normal. Mastoids are clear. Other: None significant IMPRESSION: Normal head CT. Electronically Signed   By: Paulina Fusi M.D.   On: 02/12/2021 10:07        Scheduled Meds:  colchicine  0.6 mg Oral BID   diazepam  5 mg Oral BID   linaclotide  290 mcg Oral Daily   meclizine  25 mg Oral TID   pantoprazole (PROTONIX) IV  40 mg Intravenous Q24H   scopolamine  1 patch Transdermal Once   [START ON 02/15/2021] scopolamine  1 patch Transdermal Q72H   Continuous Infusions:  sodium chloride 100 mL/hr at 02/12/21 1750     LOS: 0 days    Time spent: 40 min    Silvano Bilis, MD [facility]   If 7PM-7AM, please contact night-coverage [email or web address] Password  [facility] 02/13/2021, 10:33 AM

## 2021-02-14 ENCOUNTER — Observation Stay: Payer: BC Managed Care – PPO

## 2021-02-14 DIAGNOSIS — H8110 Benign paroxysmal vertigo, unspecified ear: Secondary | ICD-10-CM | POA: Diagnosis not present

## 2021-02-14 IMAGING — MR MR HEAD WO/W CM
15 series · 48 of 48 positions shown · IV contrast (6ml Gadavist)
Comparison: Prior CTA from earlier same day as well as recent MRI
from [DATE].

CLINICAL DATA: Initial evaluation for neuro deficit, stroke
suspected.

EXAM:
MRI HEAD WITHOUT AND WITH CONTRAST
TECHNIQUE: Multiplanar, multiecho pulse sequences of the brain and surrounding
structures were obtained without and with intravenous contrast.
CONTRAST:  7.5mL GADAVIST GADOBUTROL 1 MMOL/ML IV SOLN

[Series 5: ax dwi_tracew · axial · 3.0mm · 0.65mm/px · z∈[-89,+64]mm · 2 of 48 slices shown]
[im 1/48]
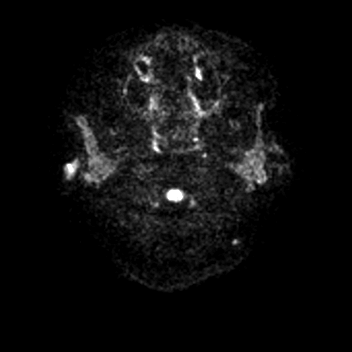
[im 48/48]
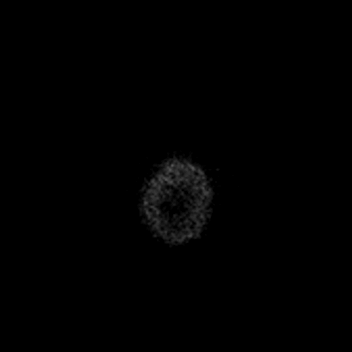

[Series 6: ax dwi_adc · axial · 3.0mm · 0.65mm/px · z∈[-89,+64]mm · 3 of 47 slices shown]
[im 1/47]
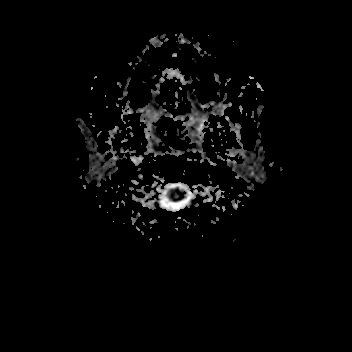
[im 24/47]
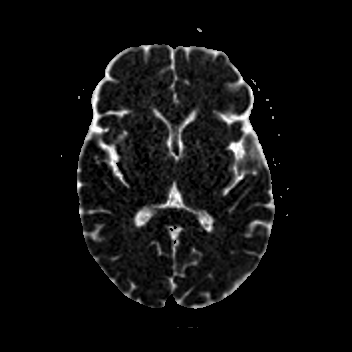
[im 47/47]
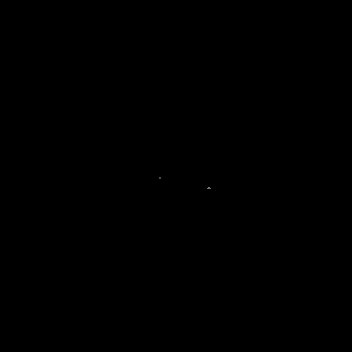

[Series 7: cor dwi_tracew · coronal · 5.0mm · 0.68mm/px · 2 of 40 slices shown]
[im 1/40]
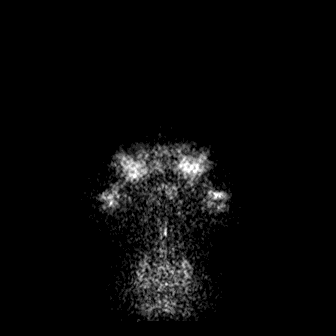
[im 40/40]
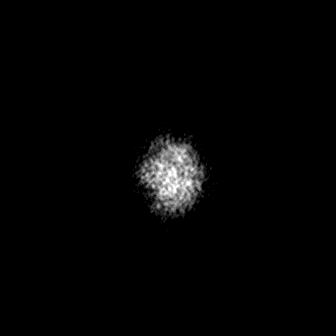

[Series 8: cor dwi_adc · coronal · 5.0mm · 0.68mm/px · 2 of 39 slices shown]
[im 1/39]
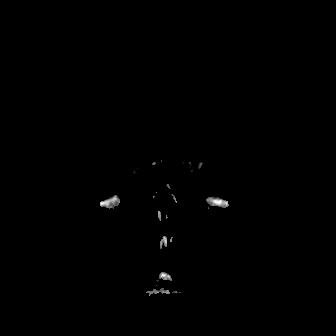
[im 39/39]
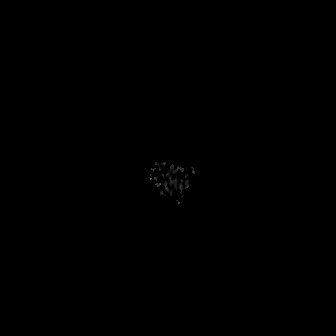

[Series 9: T1 · sagittal · 5.0mm · 0.62mm/px · 1 of 25 slices shown (1 of 2)]
[im 1/25]
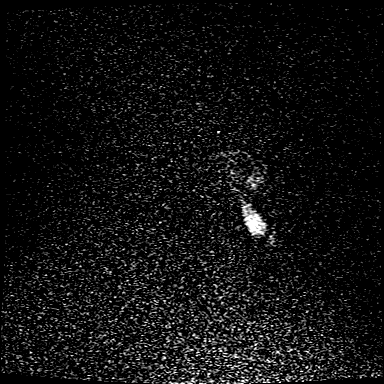

[Series 10: T2 · axial · 5.0mm · 0.53mm/px · 1 of 26 slices shown (1 of 2)]
[im 1/26]
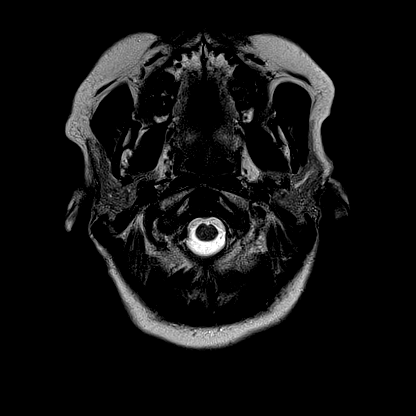

[Series 11: mag_images · axial · 3.0mm · 0.90mm/px · z∈[-100,+74]mm · 3 of 60 slices shown]
[im 1/60]
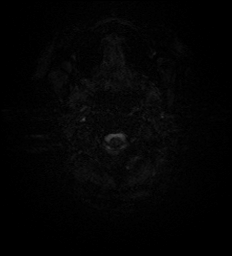
[im 30/60]
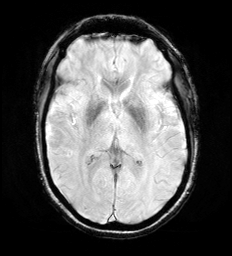
[im 60/60]
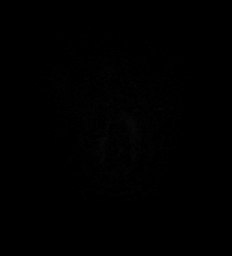

[Series 12: pha_images · axial · 3.0mm · 0.90mm/px · z∈[-100,+74]mm · 3 of 60 slices shown]
[im 1/60]
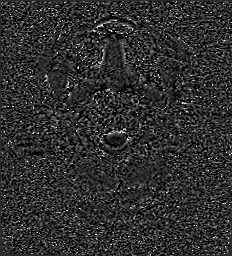
[im 30/60]
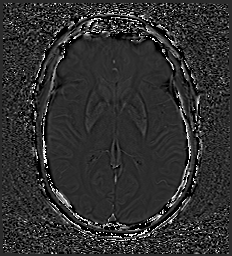
[im 60/60]
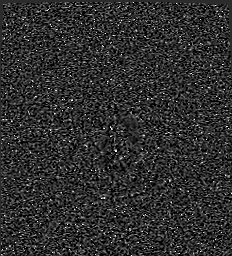

[Series 13: swi_images · axial · 3.0mm · 0.90mm/px · z∈[-100,+74]mm · 3 of 60 slices shown]
[im 1/60]
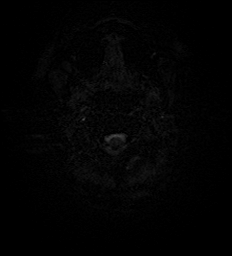
[im 30/60]
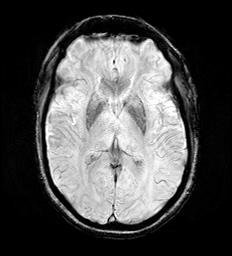
[im 60/60]
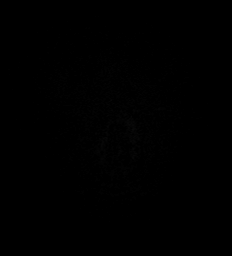

[Series 14: mip_images(sw) · axial · 24.0mm · 0.90mm/px · z∈[-90,+64]mm · 3 of 53 slices shown]
[im 1/53]
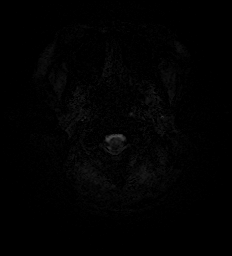
[im 27/53]
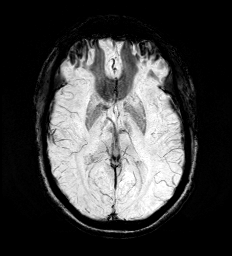
[im 53/53]
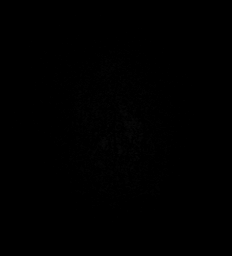

[Series 15: FLAIR · axial · 3.0mm · 0.53mm/px · z∈[-92,+68]mm · 3 of 55 slices shown]
[im 1/55]
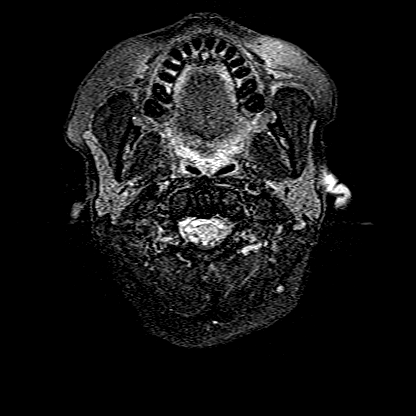
[im 28/55]
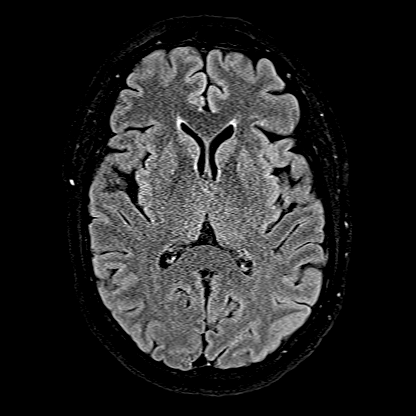
[im 55/55]
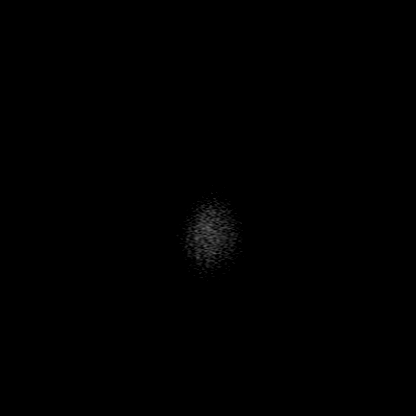

[Series 16: T1 · axial · 1.0mm · 0.98mm/px · z∈[-84,+73]mm · 9 of 160 slices shown (2 of 2)]
[im 1/160]
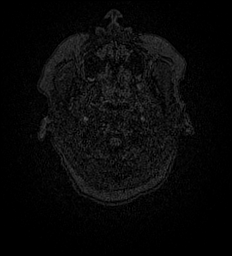
[im 20/160]
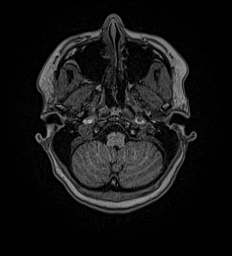
[im 40/160]
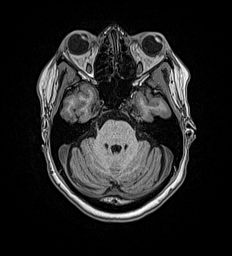
[im 60/160]
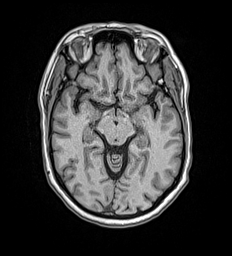
[im 80/160]
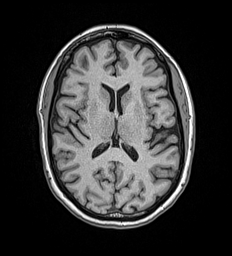
[im 100/160]
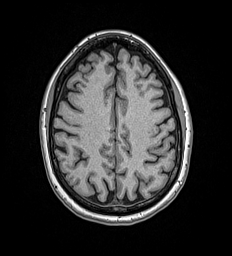
[im 120/160]
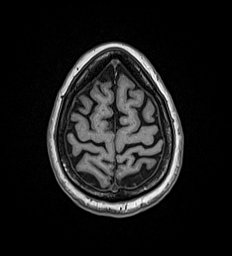
[im 140/160]
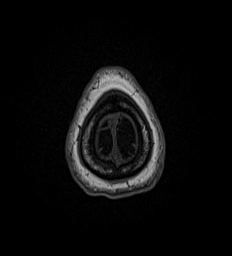
[im 160/160]
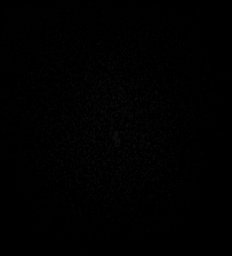

[Series 17: T2 · coronal · 5.0mm · 0.45mm/px · 2 of 31 slices shown (2 of 2)]
[im 1/31]
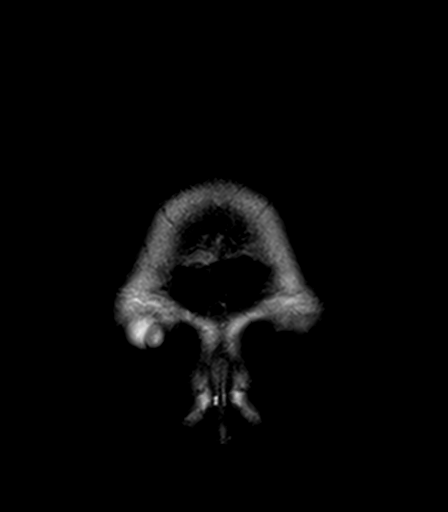
[im 31/31]
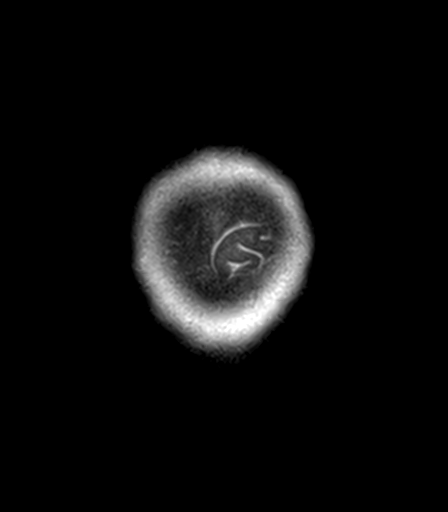

[Series 18: T1 post-contrast · axial · 1.0mm · 0.98mm/px · z∈[-84,+73]mm · 9 of 160 slices shown (1 of 2)]
[im 1/160]
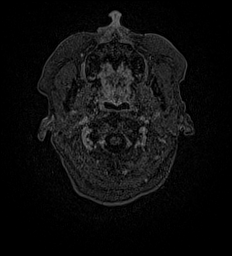
[im 20/160]
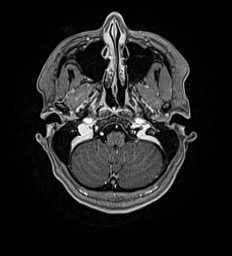
[im 40/160]
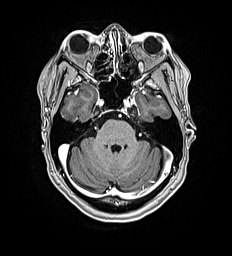
[im 60/160]
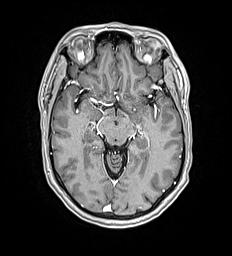
[im 80/160]
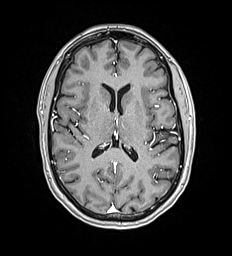
[im 100/160]
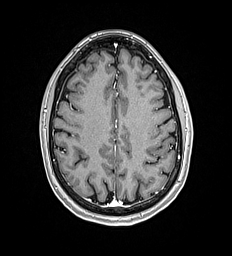
[im 120/160]
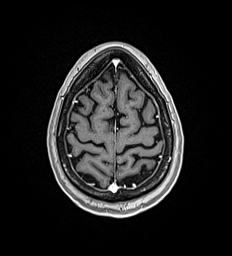
[im 140/160]
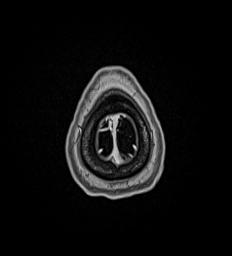
[im 160/160]
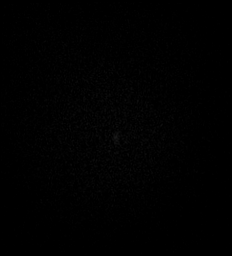

[Series 19: T1 post-contrast · coronal · 5.0mm · 0.57mm/px · 2 of 29 slices shown (2 of 2)]
[im 1/29]
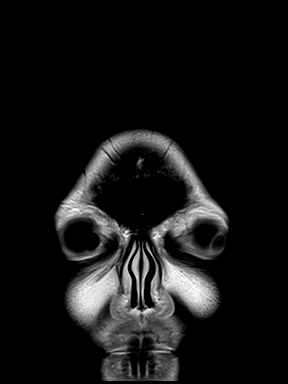
[im 29/29]
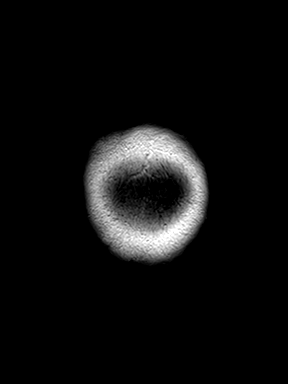

[48 of 48 positions shown; findings below may reference images not displayed]

FINDINGS: Brain: Cerebral volume within normal limits for patient age. No
focal parenchymal signal abnormality identified.

No abnormal foci of restricted diffusion to suggest acute or
subacute ischemia. Gray-white matter differentiation well
maintained. No encephalomalacia to suggest chronic infarction. No
foci of susceptibility artifact to suggest acute or chronic
intracranial hemorrhage.

No mass lesion, midline shift or mass effect. No hydrocephalus. No
extra-axial fluid collection.

Pituitary gland and suprasellar region are normal. Midline
structures intact. Incidental note made of a benign 12 mm
nonenhancing simple cyst within the pineal gland.

No abnormal enhancement.

Vascular: Major intracranial vascular flow voids well maintained.

Skull and upper cervical spine: Craniocervical junction normal.
Visualized upper cervical spine within normal limits. Bone marrow
signal intensity normal. No scalp soft tissue abnormality.

Sinuses/Orbits: Left gaze noted. Globes orbital soft tissues
demonstrate no other acute finding.

Paranasal sinuses are largely clear. Small left mastoid effusion
noted. Inner ear structures grossly normal.

Other: None.
IMPRESSION: Stable normal brain MRI. No acute intracranial abnormality
identified.

## 2021-02-14 IMAGING — CT CT ANGIO HEAD-NECK (W OR W/O PERF)
2 of 11 series · 6 of 35 positions shown · IV contrast (omnipaque)
Comparison: None.

CLINICAL DATA: Vertigo, central r/o vetebrobasilar insufficiency in
patient with vertigo

EXAM:
CT ANGIOGRAPHY HEAD AND NECK
TECHNIQUE: Multidetector CT imaging of the head and neck was performed using
the standard protocol during bolus administration of intravenous
contrast. Multiplanar CT image reconstructions and MIPs were
obtained to evaluate the vascular anatomy. Carotid stenosis
measurements (when applicable) are obtained utilizing NASCET
criteria, using the distal internal carotid diameter as the
denominator.
CONTRAST:  75mL OMNIPAQUE IOHEXOL 350 MG/ML SOLN

[Series 10: ax thin · axial · 0.56mm/px · z∈[-331,-79]mm · 5 of 381 slices shown]
[im 64/381  soft-tissue]
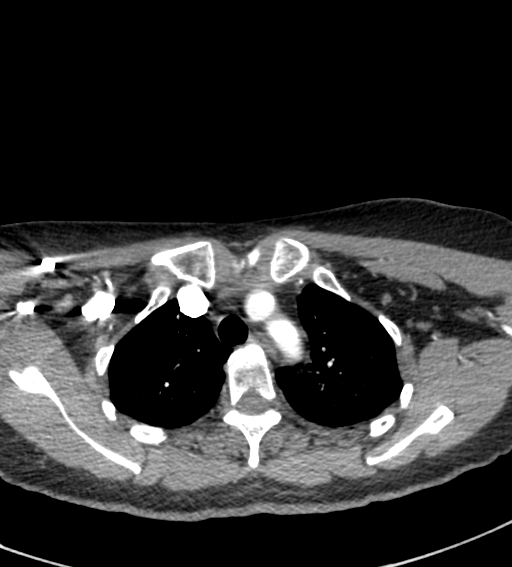
[im 127/381  bone]
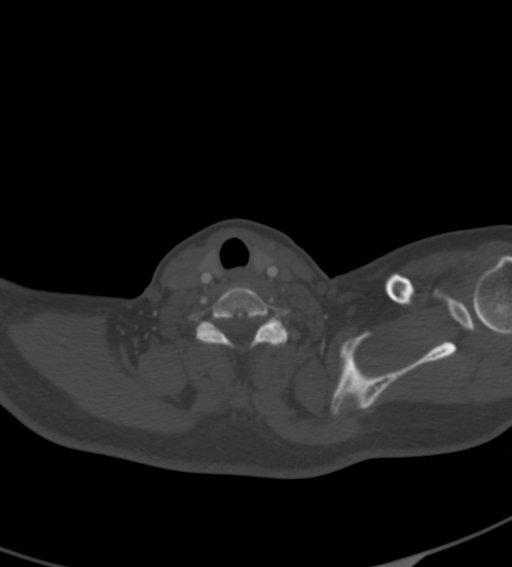
[im 191/381  soft-tissue]
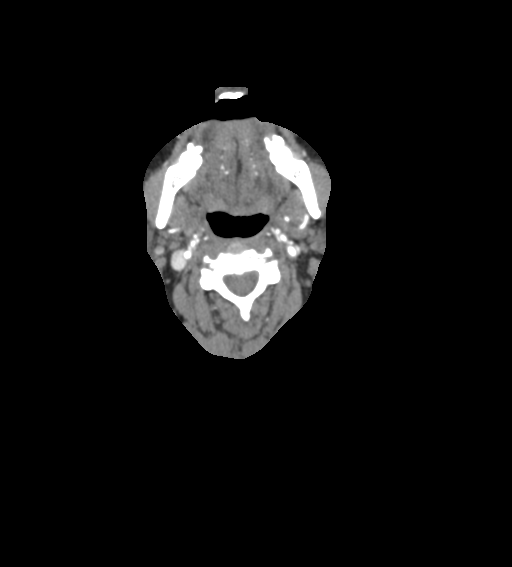
[im 254/381  bone]
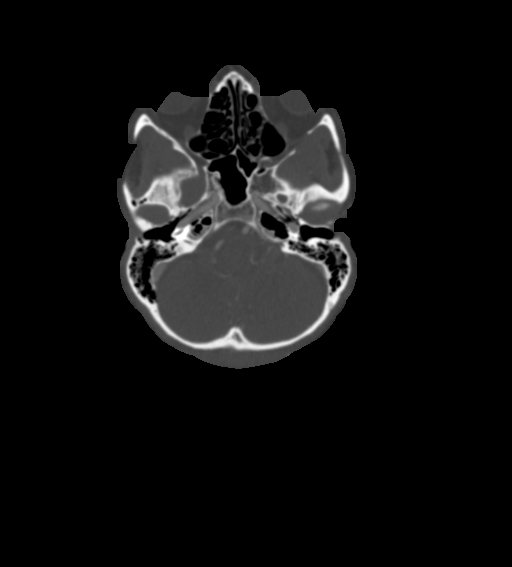
[im 317/381  soft-tissue]
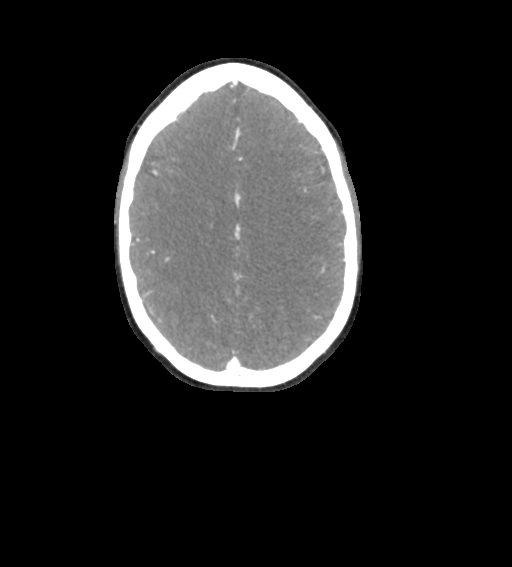

[Series 12: sagittal thin · sagittal · 0.53mm/px · 1 of 476 slices shown]
[im 158/476  soft-tissue]
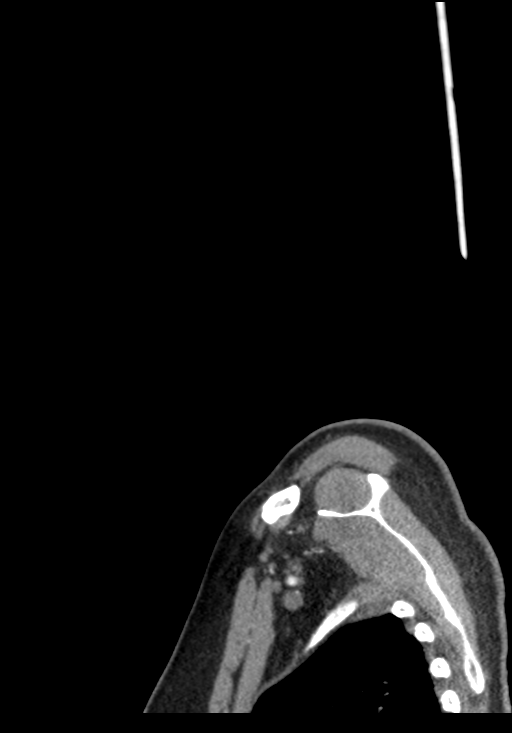

[6 of 35 positions shown; findings below may reference images not displayed]

FINDINGS: CT HEAD FINDINGS

Brain: No evidence of acute infarction, hemorrhage, hydrocephalus,
extra-axial collection or mass lesion/mass effect. Pineal cyst,
better characterized on recent prior MRI.

Vascular: See below.

Skull: No acute fracture.

Sinuses: Clear visualized sinuses.

Orbits: No acute finding.

Review of the MIP images confirms the above findings

CTA NECK FINDINGS

Aortic arch: Great vessel origins are patent.

Right carotid system: No evidence of dissection, stenosis (50% or
greater) or occlusion.

Left carotid system: No evidence of dissection, stenosis (50% or
greater) or occlusion. Minimal atherosclerosis at the carotid
bifurcation.

Vertebral arteries: Right dominant. No evidence of significant
(greater than 50%) stenosis, or dissection. Tortuous bilaterally.

Skeleton: Mild cervical spine degenerative change.

Other neck: No acute findings.

Upper chest: Clear visualized lung apices.

Review of the MIP images confirms the above findings

CTA HEAD FINDINGS

Anterior circulation: Bilateral intracranial ICAs are patent with
mild calcific atherosclerotic narrowing. Bilateral MCAs and ACAs are
patent without proximal hemodynamically significant stenosis. No
aneurysm identified.

Posterior circulation: Bilateral intradural vertebral arteries,
basilar artery, and posterior cerebral arteries are patent. Moderate
left P2 PCA stenosis. No aneurysm identified.

Venous sinuses: As permitted by contrast timing, patent.

Review of the MIP images confirms the above findings
IMPRESSION: CT head:

No evidence of acute intracranial abnormality.

CTA head:

1. No large vessel occlusion.
2. Moderate proximal left P2 PCA stenosis.

CTA neck:

No significant (greater than 50%) stenosis.

## 2021-02-14 MED ORDER — IOHEXOL 350 MG/ML SOLN
75.0000 mL | Freq: Once | INTRAVENOUS | Status: AC | PRN
  Administered 2021-02-14: 75 mL via INTRAVENOUS

## 2021-02-14 MED ORDER — GADOBUTROL 1 MMOL/ML IV SOLN
6.0000 mL | Freq: Once | INTRAVENOUS | Status: AC | PRN
  Administered 2021-02-14: 7.5 mL via INTRAVENOUS

## 2021-02-14 NOTE — Progress Notes (Addendum)
PROGRESS NOTE    Monica MONCIVAIS  LOV:[id number] DOB: 07/27/1960 DOA: 02/12/2021 PCP: Lynwood Dawley, MD  Outpatient Specialists: cardiology    Brief Narrative:   From admission h and p Monica Colon is a 60 y.o. female with medical history significant for hypertension and dyslipidemia who presents to the emergency room for the second time in 1 week for evaluation of persistent dizziness. Patient states that her symptoms started about a month ago and she has had symptoms intermittently since then.  She was seen at an urgent care center and prescribed meclizine which she took as prescribed.  Symptoms occurred 2 days prior to this admission and she describes it as feeling like she is spinning in the room.  She was seen in the ER and had an MRI of the brain done which was unremarkable.  She was discharged home but returns today due to persistent dizziness now associated with nausea and multiple episodes of emesis.  Any form of movement makes her symptoms worse.  She denies having any headache, no blurred vision, no tinnitus, no hearing loss, no extremity weakness, no numbness, no tingling, no slurred speech, no lightheadedness, no leg swelling, no chest pain, no shortness of breath, no palpitations, no diaphoresis, no urinary symptoms, no fever, no chills, no changes in her bowel habits.    Assessment & Plan:   Principal Problem:   BPV (benign positional vertigo) Active Problems:   Essential hypertension  # Vertigo Severe. MRI neg. No hearing loss or tinnitus to suggest meniere. Initially episodic and associated w/ movement leading me to think BPPV, but now complaining of nearly constant vertigo. Had PT perform epley maneuvers.  Possible other etiologies like vestibular neuritis, vestibular migraine on ddx. Patient remains very vertiginous but symptoms have improved some, no more vomiting. She does not feel safe to discharge today. ENT says they have nothing to offer the  patient inpatient (Dr. Willeen Cass); he advises symptomatic treatment and close outpt f/u - continue valium, meclizine, zofran, prednisone - discussed case w/ neurology. Low suspicion for central lesion but they do advise mri w/ contrast to r/o lesion and cta to r/o vertebrobasilar insufficiency  # HTN wnl - cont home hctz, vytorin, losartan   DVT prophylaxis: lovenox Code Status: full Family Communication:  husband updated @ bedside 11/12  Level of care: Telemetry Medical Status is: Observation  The patient will require care spanning > 2 midnights and should be moved to inpatient because: acuity of illness   Consultants:  none  Procedures: none  Antimicrobials:  none    Subjective: Vertigo with any movement. Tolerating diet. No headache.  Objective: Vitals:   02/13/21 1546 02/13/21 1923 02/14/21 0530 02/14/21 0728  BP: (!) 151/78 (!) 146/82 130/80 129/77  Pulse: 65 73 (!) 58 67  Resp: 18 20 16 18   Temp: 98.3 F (36.8 C) 99 F (37.2 C) 98.2 F (36.8 C) 98.2 F (36.8 C)  TempSrc:  Oral Oral Oral  SpO2: 98% 96% 98% 96%  Weight:      Height:        Intake/Output Summary (Last 24 hours) at 02/14/2021 1222 Last data filed at 02/14/2021 0800 Gross per 24 hour  Intake 720 ml  Output 1100 ml  Net -380 ml   Filed Weights   02/12/21 0830  Weight: 68 kg    Examination:  General exam: Appears calm and comfortable  Respiratory system: Clear to auscultation. Respiratory effort normal. Cardiovascular system: S1 & S2 heard, RRR. No JVD, murmurs,  rubs, gallops or clicks. No pedal edema. Gastrointestinal system: Abdomen is nondistended, soft and nontender. No organomegaly or masses felt. Normal bowel sounds heard. Central nervous system: Alert and oriented. No focal neurological deficits. Extremities: Symmetric 5 x 5 power. Skin: No rashes, lesions or ulcers Psychiatry: Judgement and insight appear normal. Mood & affect appropriate.     Data Reviewed: I have  personally reviewed following labs and imaging studies  CBC: Recent Labs  Lab 02/09/21 2002 02/12/21 1225 02/13/21 0415  WBC 6.2 7.4 9.2  NEUTROABS  --  6.8  --   HGB 14.5 13.3 13.0  HCT 42.9 39.1 38.6  MCV 94.9 94.2 93.5  PLT 331 288 342   Basic Metabolic Panel: Recent Labs  Lab 02/09/21 2002 02/12/21 1225 02/13/21 0415  NA 139 137 135  K 3.0* 3.8 3.6  CL 101 104 105  CO2 31 26 23   GLUCOSE 99 142* 103*  BUN 14 12 12   CREATININE 0.81 0.79 0.64  CALCIUM 9.5 8.4* 8.5*   GFR: Estimated Creatinine Clearance: 67.3 mL/min (by C-G formula based on SCr of 0.64 mg/dL). Liver Function Tests: Recent Labs  Lab 02/12/21 1225  AST 28  ALT 21  ALKPHOS 47  BILITOT 0.9  PROT 6.4*  ALBUMIN 3.3*   No results for input(s): LIPASE, AMYLASE in the last 168 hours. No results for input(s): AMMONIA in the last 168 hours. Coagulation Profile: No results for input(s): INR, PROTIME in the last 168 hours. Cardiac Enzymes: No results for input(s): CKTOTAL, CKMB, CKMBINDEX, TROPONINI in the last 168 hours. BNP (last 3 results) No results for input(s): PROBNP in the last 8760 hours. HbA1C: No results for input(s): HGBA1C in the last 72 hours. CBG: No results for input(s): GLUCAP in the last 168 hours. Lipid Profile: No results for input(s): CHOL, HDL, LDLCALC, TRIG, CHOLHDL, LDLDIRECT in the last 72 hours. Thyroid Function Tests: No results for input(s): TSH, T4TOTAL, FREET4, T3FREE, THYROIDAB in the last 72 hours. Anemia Panel: No results for input(s): VITAMINB12, FOLATE, FERRITIN, TIBC, IRON, RETICCTPCT in the last 72 hours. Urine analysis: No results found for: COLORURINE, APPEARANCEUR, LABSPEC, PHURINE, GLUCOSEU, HGBUR, BILIRUBINUR, KETONESUR, PROTEINUR, UROBILINOGEN, NITRITE, LEUKOCYTESUR Sepsis Labs: @LABRCNTIP (procalcitonin:4,lacticidven:4)  ) Recent Results (from the past 240 hour(s))  Resp Panel by RT-PCR (Flu A&B, Covid) Nasopharyngeal Swab     Status: None   Collection  Time: 02/12/21  2:58 PM   Specimen: Nasopharyngeal Swab; Nasopharyngeal(NP) swabs in vial transport medium  Result Value Ref Range Status   SARS Coronavirus 2 by RT PCR NEGATIVE NEGATIVE Final    Comment: (NOTE) SARS-CoV-2 target nucleic acids are NOT DETECTED.  The SARS-CoV-2 RNA is generally detectable in upper respiratory specimens during the acute phase of infection. The lowest concentration of SARS-CoV-2 viral copies this assay can detect is 138 copies/mL. A negative result does not preclude SARS-Cov-2 infection and should not be used as the sole basis for treatment or other patient management decisions. A negative result may occur with  improper specimen collection/handling, submission of specimen other than nasopharyngeal swab, presence of viral mutation(s) within the areas targeted by this assay, and inadequate number of viral copies(<138 copies/mL). A negative result must be combined with clinical observations, patient history, and epidemiological information. The expected result is Negative.  Fact Sheet for Patients:  13/10/22  Fact Sheet for Healthcare Providers:   This test is no t yet approved or cleared by the 13/10/22 FDA and  has been authorized for detection and/or diagnosis of SARS-CoV-2  by FDA under an Emergency Use Authorization (EUA). This EUA will remain  in effect (meaning this test can be used) for the duration of the COVID-19 declaration under Section 564(b)(1) of the Act, 21 U.S.C.section 360bbb-3(b)(1), unless the authorization is terminated  or revoked sooner.       Influenza A by PCR NEGATIVE NEGATIVE Final   Influenza B by PCR NEGATIVE NEGATIVE Final    Comment: (NOTE) The Xpert Xpress SARS-CoV-2/FLU/RSV plus assay is intended as an aid in the diagnosis of influenza from Nasopharyngeal swab specimens and should not be used as a sole basis for treatment. Nasal washings  and aspirates are unacceptable for Xpert Xpress SARS-CoV-2/FLU/RSV testing.  Fact Sheet for Patients: BloggerCourse.com  Fact Sheet for Healthcare Providers: SeriousBroker.it  This test is not yet approved or cleared by the [place] FDA and has been authorized for detection and/or diagnosis of SARS-CoV-2 by FDA under an Emergency Use Authorization (EUA). This EUA will remain in effect (meaning this test can be used) for the duration of the COVID-19 declaration under Section 564(b)(1) of the Act, 21 U.S.C. section 360bbb-3(b)(1), unless the authorization is terminated or revoked.  Performed at [facility], [address], [place], [place] [address]          Radiology Studies: No results found.      Scheduled Meds:  diazepam  5 mg Oral BID   ezetimibe  5 mg Oral Daily   And   simvastatin  5 mg Oral Daily   hydrochlorothiazide  12.5 mg Oral Daily   linaclotide  290 mcg Oral Daily   losartan  100 mg Oral Daily   meclizine  25 mg Oral TID   pantoprazole  40 mg Oral Daily   predniSONE  60 mg Oral Q breakfast   [COMPLETED] scopolamine  1 patch Transdermal Once   Continuous Infusions:     LOS: 0 days    Time spent: 25 min    Silvano Bilis, MD [facility]   If 7PM-7AM, please contact night-coverage [email or web address] Password [facility] 02/14/2021, 12:22 PM

## 2021-02-14 NOTE — TOC Progression Note (Signed)
Transition of Care [facility]) - Progression Note    Patient Details  Name: Monica Colon MRN: [id number] Date of Birth: 07-14-1960  Transition of Care [facility]) CM/SW Contact  Liliana Cline, LCSW Phone Number: 02/14/2021, 4:27 PM  Clinical Narrative:   Faxed OP Vestibular Rehab referral to [facility].    Expected Discharge Plan: OP Rehab Barriers to Discharge: Continued Medical Work up  Expected Discharge Plan and Services Expected Discharge Plan: OP Rehab     Post Acute Care Choice:  (Outpatient vestibular therapy) Living arrangements for the past 2 months: Single Family Home                                       Social Determinants of Health (SDOH) Interventions    Readmission Risk Interventions No flowsheet data found.

## 2021-02-15 DIAGNOSIS — H8110 Benign paroxysmal vertigo, unspecified ear: Secondary | ICD-10-CM | POA: Diagnosis not present

## 2021-02-15 MED ORDER — DIAZEPAM 2 MG PO TABS: 2.0000 mg | ORAL_TABLET | Freq: Three times a day (TID) | ORAL | 0 refills | Status: AC | PRN

## 2021-02-15 MED ORDER — PREDNISONE 20 MG PO TABS: ORAL_TABLET | ORAL | 0 refills | Status: DC

## 2021-02-15 MED ORDER — ASPIRIN EC 81 MG PO TBEC: 81.0000 mg | DELAYED_RELEASE_TABLET | Freq: Every day | ORAL | 2 refills | Status: DC

## 2021-02-15 MED ORDER — MECLIZINE HCL 25 MG PO TABS: 25.0000 mg | ORAL_TABLET | Freq: Three times a day (TID) | ORAL | 0 refills | Status: DC

## 2021-02-15 NOTE — Discharge Summary (Signed)
Monica Colon:[id number] DOB: [birth year] DOA: 02/12/2021  PCP: Lynwood Dawley, MD  Admit date: 02/12/2021 Discharge date: 02/15/2021  Time spent: 35 minutes  Recommendations for Outpatient Follow-up:  Vestibular rehab PT ordered Close f/u with ENT     Discharge Diagnoses:  Principal Problem:   BPV (benign positional vertigo) Active Problems:   Essential hypertension   Discharge Condition: stable  Diet recommendation: heart healthy  Filed Weights   02/12/21 0830  Weight: 68 kg    History of present illness:  Monica Colon is a 60 y.o. female with medical history significant for hypertension and dyslipidemia who presents to the emergency room for the second time in 1 week for evaluation of persistent dizziness. Patient states that her symptoms started about a month ago and she has had symptoms intermittently since then.  She was seen at an urgent care center and prescribed meclizine which she took as prescribed.  Symptoms occurred 2 days prior to this admission and she describes it as feeling like she is spinning in the room.  She was seen in the ER and had an MRI of the brain done which was unremarkable.  She was discharged home but returns today due to persistent dizziness now associated with nausea and multiple episodes of emesis.  Any form of movement makes her symptoms worse.  She denies having any headache, no blurred vision, no tinnitus, no hearing loss, no extremity weakness, no numbness, no tingling, no slurred speech, no lightheadedness, no leg swelling, no chest pain, no shortness of breath, no palpitations, no diaphoresis, no urinary symptoms, no fever, no chills, no changes in her bowel habits.   Monica Colon is a 60 y.o. female with medical history significant for hypertension and dyslipidemia who presents to the emergency room for the second time in 1 week for evaluation of persistent dizziness. Patient states that her symptoms started  about a month ago and she has had symptoms intermittently since then.  She was seen at an urgent care center and prescribed meclizine which she took as prescribed.  Symptoms occurred 2 days prior to this admission and she describes it as feeling like she is spinning in the room.  She was seen in the ER and had an MRI of the brain done which was unremarkable.  She was discharged home but returns today due to persistent dizziness now associated with nausea and multiple episodes of emesis.  Any form of movement makes her symptoms worse.  She denies having any headache, no blurred vision, no tinnitus, no hearing loss, no extremity weakness, no numbness, no tingling, no slurred speech, no lightheadedness, no leg swelling, no chest pain, no shortness of breath, no palpitations, no diaphoresis, no urinary symptoms, no fever, no chills, no changes in her bowel habits.     Hospital Course:  Patient presented with worsening vertigo. 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 Discussed case with ENT, who advised outpatient f/u for further evaluation. Will continue to treat symptomatically with prednisone taper for possible vestibular neurits, oral meclizine, and diazepam. Vomiting resolved and patient tolerating PO. Patient has also been referred to vestibular rehab.   Procedures: none   Consultations: none  Discharge Exam: Vitals:   02/15/21 0404 02/15/21 0727  BP: 130/75 129/82  Pulse: (!) 56 (!) 54  Resp: 16 18  Temp: 97.6 F (36.4 C) 97.6 F (36.4 C)  SpO2: 100%  93%    General exam: Appears calm and comfortable  Respiratory system: Clear to auscultation. Respiratory effort normal. Cardiovascular system: S1 & S2 heard, RRR. No JVD, murmurs, rubs, gallops or clicks. No pedal edema. Gastrointestinal system: Abdomen is nondistended, soft and nontender. No organomegaly or masses felt.  Normal bowel sounds heard. Central nervous system: Alert and oriented. No focal neurological deficits. Extremities: Symmetric 5 x 5 power. Skin: No rashes, lesions or ulcers Psychiatry: Judgement and insight appear normal. Mood & affect appropriate.   Discharge Instructions   Discharge Instructions     Diet - low sodium heart healthy   Complete by: As directed    Increase activity slowly   Complete by: As directed       Allergies as of 02/15/2021       Reactions   Penicillins    Has patient had a PCN reaction causing immediate rash, facial/tongue/throat swelling, SOB or lightheadedness with hypotension: Yes Has patient had a PCN reaction causing severe rash involving mucus membranes or skin necrosis: No Has patient had a PCN reaction that required hospitalization: No Has patient had a PCN reaction occurring within the last 10 years: No If all of the above answers are "NO", then may proceed with Cephalosporin use.        Medication List     TAKE these medications    Align 4 MG Caps Take 1 tablet by mouth daily.   aspirin EC 81 MG tablet Take 1 tablet (81 mg total) by mouth daily. Take after 12 weeks for prevention of preeclampsia later in pregnancy   Biotin 5 MG Tabs Take 1 tablet by mouth daily.   cholecalciferol 25 MCG (1000 UNIT) tablet Commonly known as: VITAMIN D3 Take 5,000 Units by mouth daily.   colchicine 0.6 MG tablet Take 1 tablet (0.6 mg total) by mouth in the morning and at bedtime.   diazepam 2 MG tablet Commonly known as: Valium Take 1 tablet (2 mg total) by mouth every 8 (eight) hours as needed for up to 14 days (dizziness).   ezetimibe-simvastatin 10-10 MG tablet Commonly known as: VYTORIN Take 0.5 tablets by mouth daily.   fish oil-omega-3 fatty acids 1000 MG capsule Take 2 g by mouth daily.   hydrochlorothiazide 12.5 MG capsule Commonly known as: MICROZIDE Take 1 capsule (12.5 mg total) by mouth daily.   linaclotide 290 MCG Caps  capsule Commonly known as: LINZESS Take 290 mcg by mouth daily.   losartan 50 MG tablet Commonly known as: COZAAR Take 2 tablets (100 mg total) by mouth daily. Patient only takes 50mg  daily most days   meclizine 25 MG tablet Commonly known as: ANTIVERT Take 1 tablet (25 mg total) by mouth 3 (three) times daily.   multivitamin capsule Take 1 capsule by mouth daily.   omeprazole 40 MG capsule Commonly known as: PRILOSEC Take 1 capsule (40 mg total) by mouth daily as needed.   predniSONE 20 MG tablet Commonly known as: DELTASONE 11/14: 3 tabs; 11/15: 3 tabs; 11/16: 2 1/2 tabs; 11/17: 2 tabs; 11/18: 1 1/2 tabs; 11/19: 1 tab; 11/20: 1/2 tab Start taking on: February 16, 2021   propranolol 10 MG tablet Commonly known as: INDERAL Take 1 tablet (10 mg total) by mouth 3 (three) times daily as needed.   scopolamine 1 MG/3DAYS Commonly known as: Transderm-Scop (1.5 MG) Place 1 patch (1.5 mg total) onto the skin every 3 (three) days.       Allergies  Allergen Reactions   Penicillins  Has patient had a PCN reaction causing immediate rash, facial/tongue/throat swelling, SOB or lightheadedness with hypotension: Yes Has patient had a PCN reaction causing severe rash involving mucus membranes or skin necrosis: No Has patient had a PCN reaction that required hospitalization: No Has patient had a PCN reaction occurring within the last 10 years: No If all of the above answers are "NO", then may proceed with Cephalosporin use.    Follow-up Information     Lynwood Dawley, MD Follow up.   Specialty: Training and development officer information: [address] [place] [address] [phone]         Geanie Logan, MD. Call.   Specialty: Otolaryngology Contact information: [address] [place] [address] [phone]                  The results of significant diagnostics from this hospitalization (including imaging,  microbiology, ancillary and laboratory) are listed below for reference.    Significant Diagnostic Studies: CT ANGIO HEAD NECK W WO CM  Result Date: 02/14/2021 CLINICAL DATA:  Vertigo, central r/o vetebrobasilar insufficiency in patient with vertigo EXAM: CT ANGIOGRAPHY HEAD AND NECK TECHNIQUE: Multidetector CT imaging of the head and neck was performed using the standard protocol during bolus administration of intravenous contrast. Multiplanar CT image reconstructions and MIPs were obtained to evaluate the vascular anatomy. Carotid stenosis measurements (when applicable) are obtained utilizing NASCET criteria, using the distal internal carotid diameter as the denominator. CONTRAST:  52mL OMNIPAQUE IOHEXOL 350 MG/ML SOLN COMPARISON:  None. FINDINGS: CT HEAD FINDINGS Brain: No evidence of acute infarction, hemorrhage, hydrocephalus, extra-axial collection or mass lesion/mass effect. 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 Vascular: See below. Skull: No acute fracture. Sinuses: Clear visualized sinuses. Orbits: No acute finding. Review of the MIP images confirms the above findings CTA NECK FINDINGS Aortic arch: Great vessel origins are patent. Right carotid system: No evidence of dissection, stenosis (50% or greater) or occlusion. Left carotid system: No evidence of dissection, stenosis (50% or greater) or occlusion. Minimal atherosclerosis at the carotid bifurcation. Vertebral arteries: Right dominant. No evidence of significant (greater than 50%) stenosis, or dissection. Tortuous bilaterally. Skeleton: Mild cervical spine degenerative change. Other neck: No acute findings. Upper chest: Clear visualized lung apices. Review of the MIP images confirms the above findings CTA HEAD FINDINGS Anterior circulation: Bilateral intracranial ICAs are patent with mild calcific atherosclerotic narrowing. Bilateral MCAs and ACAs are patent without proximal hemodynamically significant stenosis. No aneurysm  identified. Posterior circulation: Bilateral intradural vertebral arteries, basilar artery, and posterior cerebral arteries are patent. Moderate left P2 PCA stenosis. No aneurysm identified. Venous sinuses: As permitted by contrast timing, patent. Review of the MIP images confirms the above findings IMPRESSION: CT head: No evidence of acute intracranial abnormality. CTA head: 1. No large vessel occlusion. 2. Moderate proximal left P2 PCA stenosis. CTA neck: No significant (greater than 50%) stenosis. Electronically Signed   By: Feliberto Harts M.D.   On: 02/14/2021 17:06   CT Head Wo Contrast  Result Date: 02/12/2021 CLINICAL DATA:  Dizziness, nonspecific. Symptoms over the last hour. Similar symptoms 3 days ago. EXAM: CT HEAD WITHOUT CONTRAST TECHNIQUE: Contiguous axial images were obtained from the base of the skull through the vertex without intravenous contrast. COMPARISON:  MRI 02/10/2021 FINDINGS: Brain: The brain shows a normal appearance without evidence of malformation, atrophy, old or acute small or large vessel infarction, mass lesion, hemorrhage, hydrocephalus or extra-axial collection. Vascular: No hyperdense vessel. No evidence of  atherosclerotic calcification. Skull: Normal.  No traumatic finding.  No focal bone lesion. Sinuses/Orbits: Sinuses are clear. Orbits appear normal. Mastoids are clear. Other: None significant IMPRESSION: Normal head CT. Electronically Signed   By: Paulina Fusi M.D.   On: 02/12/2021 10:07   MR BRAIN WO CONTRAST  Result Date: 02/10/2021 CLINICAL DATA:  Nonspecific dizziness. EXAM: MRI HEAD WITHOUT CONTRAST TECHNIQUE: Multiplanar, multiecho pulse sequences of the brain and surrounding structures were obtained without intravenous contrast. COMPARISON:  None. FINDINGS: Brain: No acute infarction, hemorrhage, hydrocephalus, extra-axial collection or mass lesion. 9 mm simple pineal cyst. Normal brain volume and white matter appearance for age Vascular: Normal flow voids,  including vertebrobasilar. Skull and upper cervical spine: Normal marrow signal Sinuses/Orbits: Negative IMPRESSION: Negative brain MRI.  No explanation for symptoms. Electronically Signed   By: Tiburcio Pea M.D.   On: 02/10/2021 07:44   MR BRAIN W WO CONTRAST  Result Date: 02/14/2021 CLINICAL DATA:  Initial evaluation for neuro deficit, stroke suspected. EXAM: MRI HEAD WITHOUT AND WITH CONTRAST TECHNIQUE: Multiplanar, multiecho pulse sequences of the brain and surrounding structures were obtained without and with intravenous contrast. CONTRAST:  7.92mL GADAVIST GADOBUTROL 1 MMOL/ML IV SOLN COMPARISON:  Prior CTA from earlier same day as well as recent MRI from 02/10/2021. FINDINGS: Brain: Cerebral volume within normal limits for patient age. No focal parenchymal signal abnormality identified. No abnormal foci of restricted diffusion to suggest acute or subacute ischemia. Gray-white matter differentiation well maintained. No encephalomalacia to suggest chronic infarction. No foci of susceptibility artifact to suggest acute or chronic intracranial hemorrhage. No mass lesion, midline shift or mass effect. No hydrocephalus. No extra-axial fluid collection. Pituitary gland and suprasellar region are normal. Midline structures intact. Incidental note made of a benign 12 mm nonenhancing simple cyst within the pineal gland. No abnormal enhancement. Vascular: Major intracranial vascular flow voids well maintained. Skull and upper cervical spine: Craniocervical junction normal. Visualized upper cervical spine within normal limits. Bone marrow signal intensity normal. No scalp soft tissue abnormality. Sinuses/Orbits: Left gaze noted. Globes orbital soft tissues demonstrate no other acute finding. Paranasal sinuses are largely clear. Small left mastoid effusion noted. Inner ear structures grossly normal. Other: None. IMPRESSION: Stable normal brain MRI. No acute intracranial abnormality identified. Electronically Signed    By: Rise Mu M.D.   On: 02/14/2021 20:38    Microbiology: Recent Results (from the past 240 hour(s))  Resp Panel by RT-PCR (Flu A&B, Covid) Nasopharyngeal Swab     Status: None   Collection Time: 02/12/21  2:58 PM   Specimen: Nasopharyngeal Swab; Nasopharyngeal(NP) swabs in vial transport medium  Result Value Ref Range Status   SARS Coronavirus 2 by RT PCR NEGATIVE NEGATIVE Final    Comment: (NOTE) SARS-CoV-2 target nucleic acids are NOT DETECTED.  The SARS-CoV-2 RNA is generally detectable in upper respiratory specimens during the acute phase of infection. The lowest concentration of SARS-CoV-2 viral copies this assay can detect is 138 copies/mL. A negative result does not preclude SARS-Cov-2 infection and should not be used as the sole basis for treatment or other patient management decisions. A negative result may occur with  improper specimen collection/handling, submission of specimen other than nasopharyngeal swab, presence of viral mutation(s) within the areas targeted by this assay, and inadequate number of viral copies(<138 copies/mL). A negative result must be combined with clinical observations, patient history, and epidemiological information. The expected result is Negative.  Fact Sheet for Patients:  BloggerCourse.com  Fact Sheet for Healthcare Providers:  SeriousBroker.it  This test is no t yet approved or cleared by the [place] and  has been authorized for detection and/or diagnosis of SARS-CoV-2 by FDA under an Emergency Use Authorization (EUA). This EUA will remain  in effect (meaning this test can be used) for the duration of the COVID-19 declaration under Section 564(b)(1) of the Act, 21 U.S.C.section 360bbb-3(b)(1), unless the authorization is terminated  or revoked sooner.       Influenza A by PCR NEGATIVE NEGATIVE Final   Influenza B by PCR NEGATIVE NEGATIVE Final    Comment:  (NOTE) The Xpert Xpress SARS-CoV-2/FLU/RSV plus assay is intended as an aid in the diagnosis of influenza from Nasopharyngeal swab specimens and should not be used as a sole basis for treatment. Nasal washings and aspirates are unacceptable for Xpert Xpress SARS-CoV-2/FLU/RSV testing.  Fact Sheet for Patients: BloggerCourse.com  Fact Sheet for Healthcare Providers: SeriousBroker.it  This test is not yet approved or cleared by the [place] FDA and has been authorized for detection and/or diagnosis of SARS-CoV-2 by FDA under an Emergency Use Authorization (EUA). This EUA will remain in effect (meaning this test can be used) for the duration of the COVID-19 declaration under Section 564(b)(1) of the Act, 21 U.S.C. section 360bbb-3(b)(1), unless the authorization is terminated or revoked.  Performed at [facility], [address] [place], [place] [address]      Labs: Basic Metabolic Panel: Recent Labs  Lab 02/09/21 2002 02/12/21 1225 02/13/21 0415  NA 139 137 135  K 3.0* 3.8 3.6  CL 101 104 105  CO2 31 26 23   GLUCOSE 99 142* 103*  BUN 14 12 12   CREATININE 0.81 0.79 0.64  CALCIUM 9.5 8.4* 8.5*   Liver Function Tests: Recent Labs  Lab 02/12/21 1225  AST 28  ALT 21  ALKPHOS 47  BILITOT 0.9  PROT 6.4*  ALBUMIN 3.3*   No results for input(s): LIPASE, AMYLASE in the last 168 hours. No results for input(s): AMMONIA in the last 168 hours. CBC: Recent Labs  Lab 02/09/21 2002 02/12/21 1225 02/13/21 0415  WBC 6.2 7.4 9.2  NEUTROABS  --  6.8  --   HGB 14.5 13.3 13.0  HCT 42.9 39.1 38.6  MCV 94.9 94.2 93.5  PLT 331 288 342   Cardiac Enzymes: No results for input(s): CKTOTAL, CKMB, CKMBINDEX, TROPONINI in the last 168 hours. BNP: BNP (last 3 results) Recent Labs    04/25/20 1542  BNP 63.9    ProBNP (last 3 results) No results for input(s): PROBNP in the last 8760 hours.  CBG: No results  for input(s): GLUCAP in the last 168 hours.     Signed:  13/11/22 MD.  [facility] 02/15/2021, 10:31 AM

## 2021-02-15 NOTE — Plan of Care (Signed)
  Problem: Education: Goal: Knowledge of General Education information will improve Description: Including pain rating scale, medication(s)/side effects and non-pharmacologic comfort measures Outcome: Progressing   Problem: Health Behavior/Discharge Planning: Goal: Ability to manage health-related needs will improve Outcome: Progressing   Problem: Clinical Measurements: Goal: Ability to maintain clinical measurements within normal limits will improve Outcome: Progressing Goal: Diagnostic test results will improve Outcome: Progressing   Problem: Activity: Goal: Risk for activity intolerance will decrease Outcome: Progressing   Problem: Nutrition: Goal: Adequate nutrition will be maintained Outcome: Progressing   Problem: Elimination: Goal: Will not experience complications related to bowel motility Outcome: Progressing Goal: Will not experience complications related to urinary retention Outcome: Progressing   Problem: Pain Managment: Goal: General experience of comfort will improve Outcome: Progressing   Problem: Safety: Goal: Ability to remain free from injury will improve Outcome: Progressing   Problem: Skin Integrity: Goal: Risk for impaired skin integrity will decrease Outcome: Progressing   

## 2021-02-15 NOTE — Progress Notes (Signed)
AVS given and reviewed with pt and husband. Medications discussed. All questions answered to satisfaction. Pt verbalized understanding of information given. Pt escorted off the unit with all belongings via wheelchair by volunteer services.

## 2021-04-08 ENCOUNTER — Other Ambulatory Visit: Payer: Self-pay | Admitting: Student

## 2021-04-08 DIAGNOSIS — Z1231 Encounter for screening mammogram for malignant neoplasm of breast: Secondary | ICD-10-CM

## 2021-04-30 ENCOUNTER — Other Ambulatory Visit: Payer: Self-pay

## 2021-04-30 ENCOUNTER — Ambulatory Visit
Admission: RE | Admit: 2021-04-30 | Discharge: 2021-04-30 | Disposition: A | Discharge status: Home / Self Care | Payer: BC Managed Care – PPO | Source: Ambulatory Visit | Attending: Student | Admitting: Student

## 2021-04-30 DIAGNOSIS — Z1231 Encounter for screening mammogram for malignant neoplasm of breast: Secondary | ICD-10-CM

## 2021-04-30 IMAGING — MG MM DIGITAL SCREENING BILAT W/ TOMO AND CAD
8 series · 8 of 24 positions shown · non-contrast
Comparison: Previous exam(s).

CLINICAL DATA: Screening.

EXAM:
DIGITAL SCREENING BILATERAL MAMMOGRAM WITH TOMOSYNTHESIS AND CAD
TECHNIQUE: Bilateral screening digital craniocaudal and mediolateral oblique
mammograms were obtained. Bilateral screening digital breast
tomosynthesis was performed. The images were evaluated with
computer-aided detection.

[L MLO synth-2D]
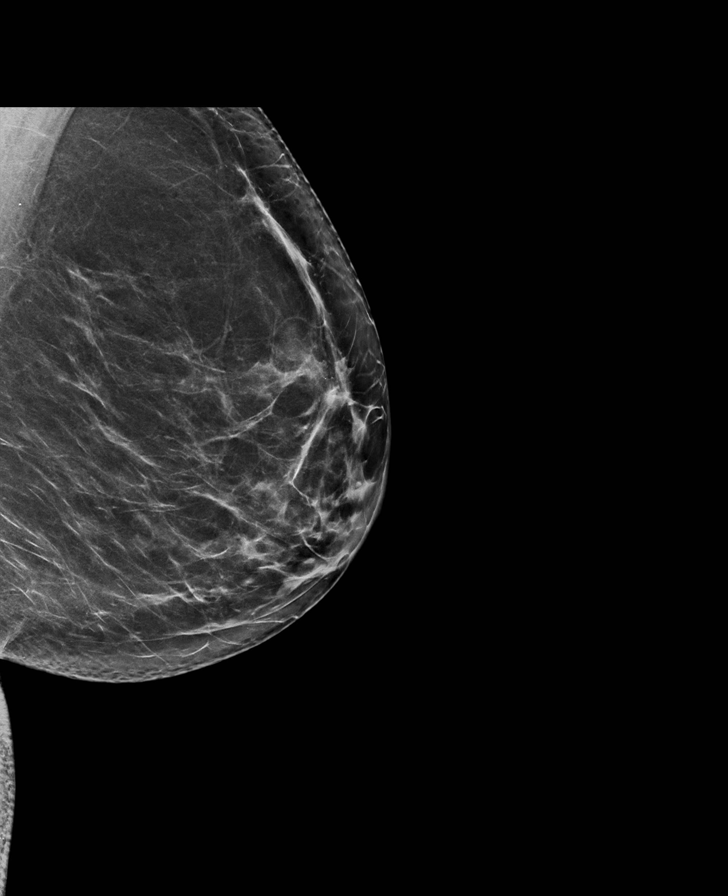

[L CC synth-2D]
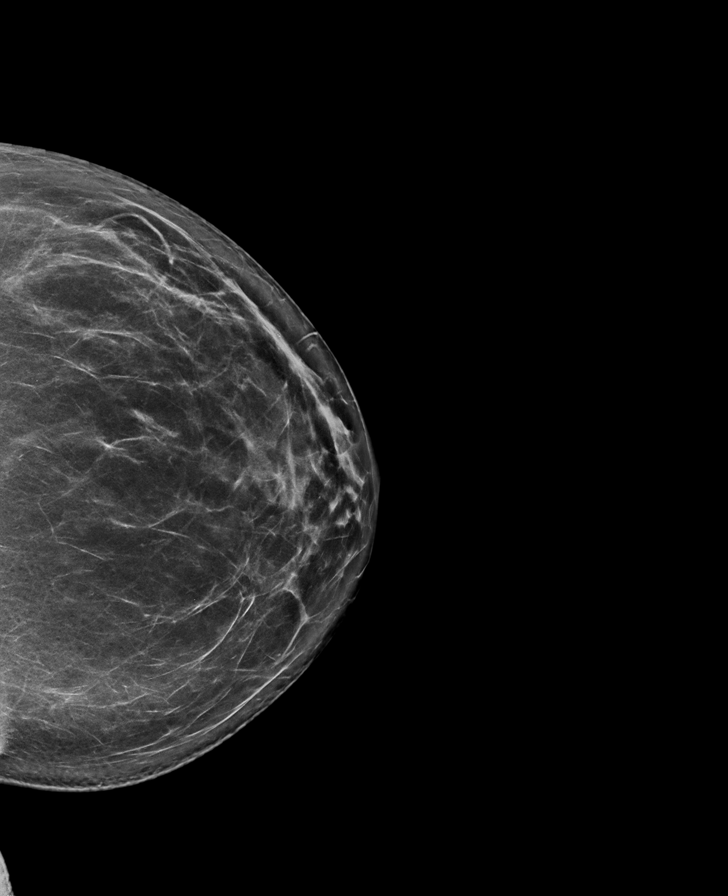

[R CC synth-2D]
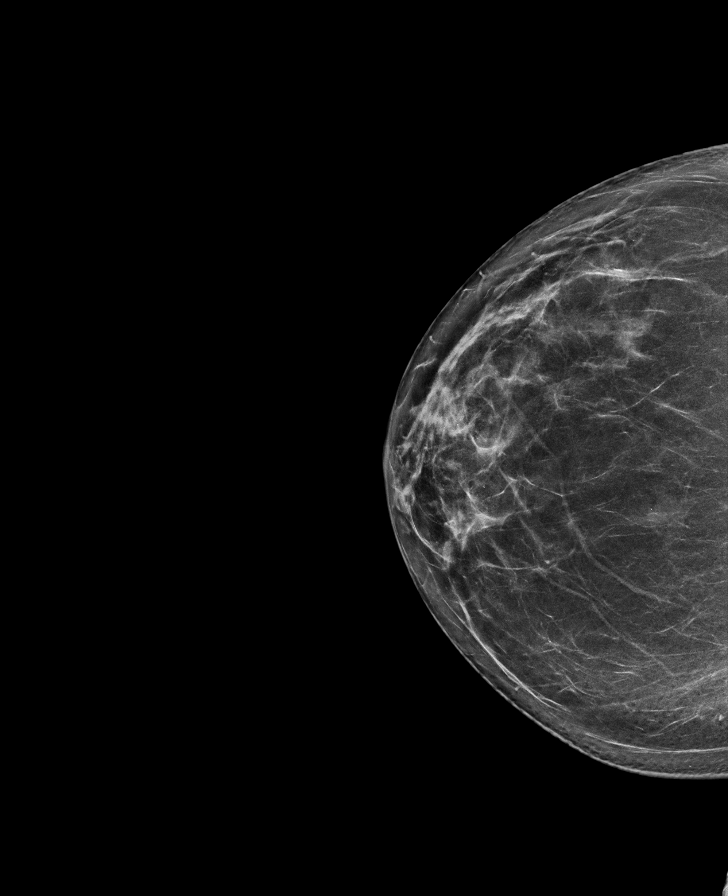

[R MLO synth-2D]
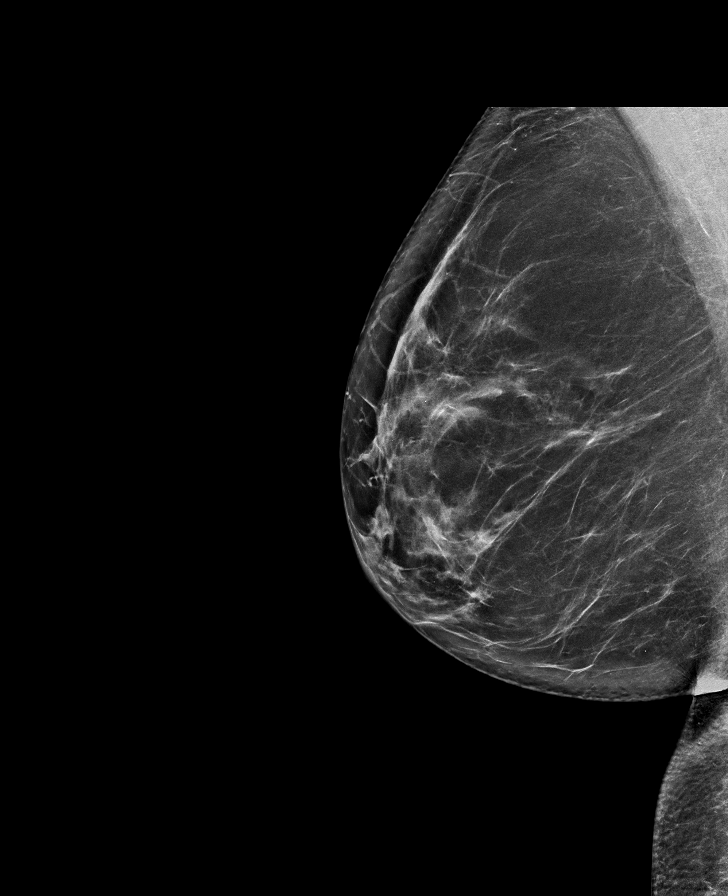

[L MLO tomo · tomo slice 43/85.0]
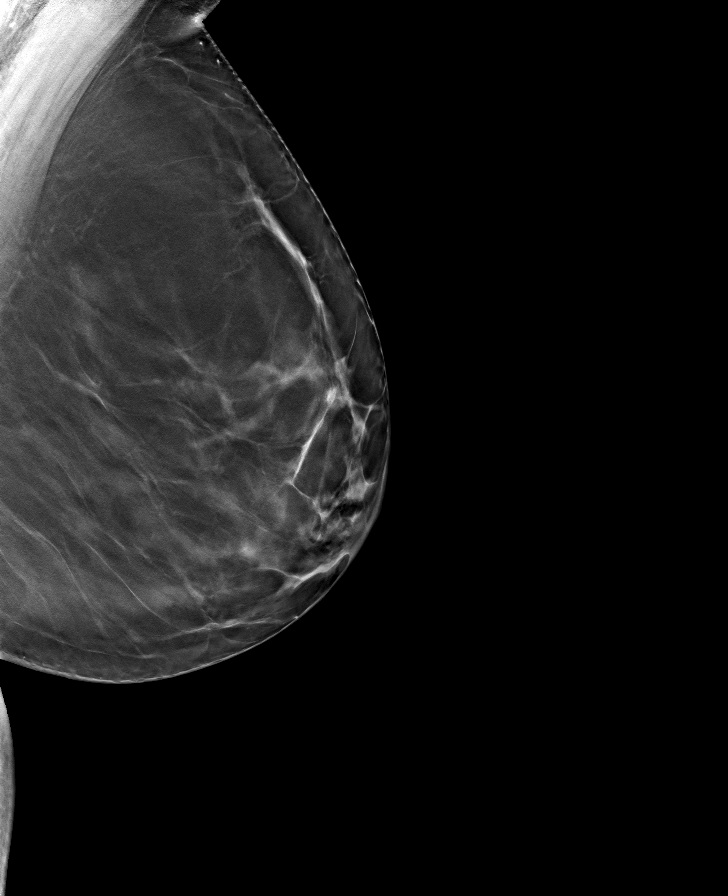

[R CC tomo · tomo slice 39/78.0]
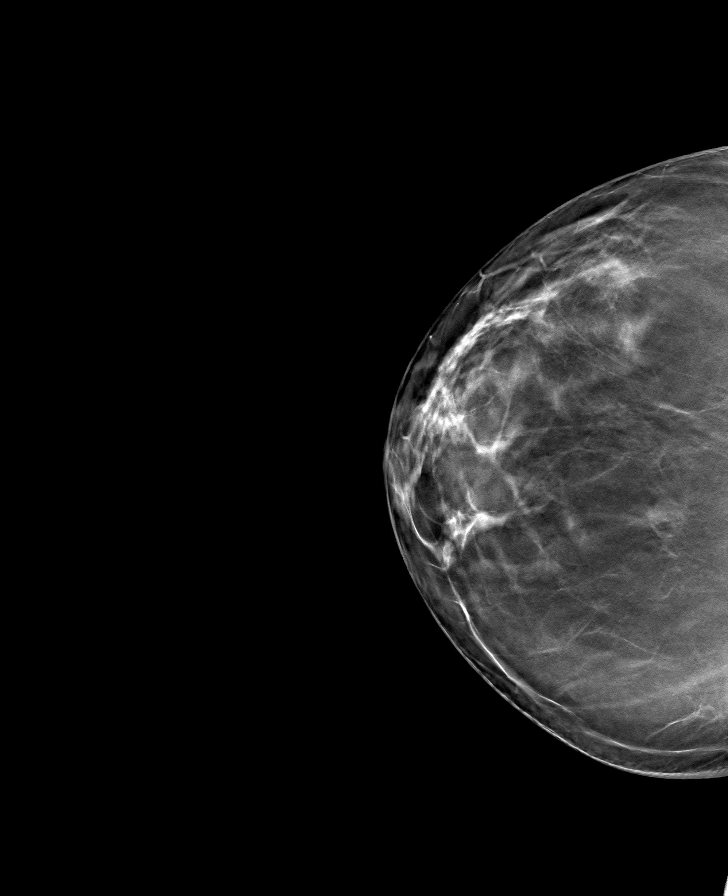

[R MLO tomo · tomo slice 43/84.0]
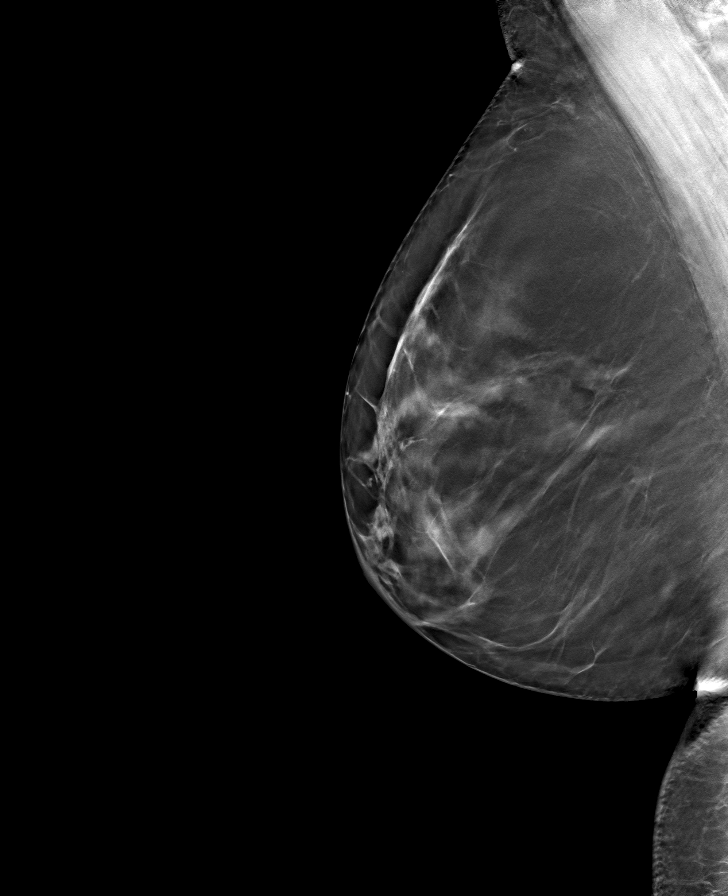

[L CC tomo · tomo slice 43/85.0]
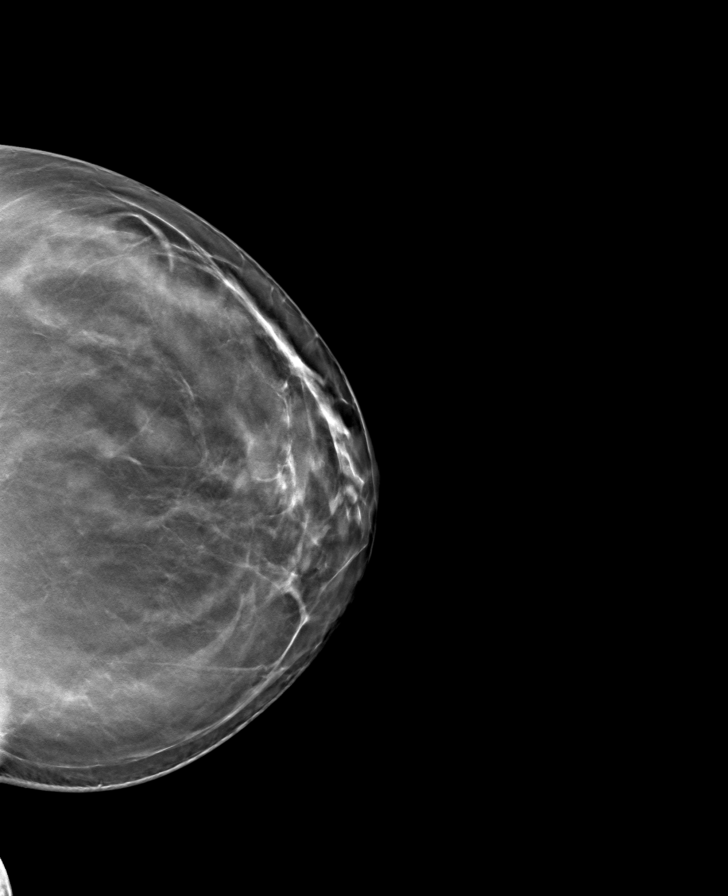

[8 of 24 positions shown; findings below may reference images not displayed]

ACR Breast Density Category c: The breast tissue is heterogeneously
dense, which may obscure small masses.
FINDINGS: There are no findings suspicious for malignancy.
IMPRESSION: No mammographic evidence of malignancy. A result letter of this
screening mammogram will be mailed directly to the patient.

RECOMMENDATION:
Screening mammogram in one year. (Code:[V2])

BI-RADS CATEGORY  1: Negative.

## 2021-07-06 ENCOUNTER — Ambulatory Visit (INDEPENDENT_AMBULATORY_CARE_PROVIDER_SITE_OTHER): Payer: BC Managed Care – PPO | Admitting: Cardiovascular Disease

## 2021-07-06 ENCOUNTER — Encounter: Payer: Self-pay | Admitting: Cardiovascular Disease

## 2021-07-06 VITALS — BP 140/80 | HR 68 | Ht 65.0 in | Wt 156.2 lb

## 2021-07-06 DIAGNOSIS — R079 Chest pain, unspecified: Secondary | ICD-10-CM | POA: Diagnosis not present

## 2021-07-06 DIAGNOSIS — I10 Essential (primary) hypertension: Secondary | ICD-10-CM

## 2021-07-06 DIAGNOSIS — E782 Mixed hyperlipidemia: Secondary | ICD-10-CM

## 2021-07-06 DIAGNOSIS — D72829 Elevated white blood cell count, unspecified: Secondary | ICD-10-CM

## 2021-07-06 DIAGNOSIS — R002 Palpitations: Secondary | ICD-10-CM | POA: Diagnosis not present

## 2021-07-06 MED ORDER — EZETIMIBE-SIMVASTATIN 10-10 MG PO TABS: 1.0000 | ORAL_TABLET | Freq: Every day | ORAL | 3 refills | Status: DC

## 2021-07-06 NOTE — Patient Instructions (Signed)
Medication Instructions:  No changes  If you need a refill on your cardiac medications before your next appointment, please call your pharmacy.   Lab work: No new labs needed  Testing/Procedures: No new testing needed  Follow-Up: At [facility], you and your health needs are our priority.  As part of our continuing mission to provide you with exceptional heart care, we have created designated Provider Care Teams.  These Care Teams include your primary Cardiologist (physician) and Advanced Practice Providers (APPs -  Physician Assistants and Nurse Practitioners) who all work together to provide you with the care you need, when you need it.  You will need a follow up appointment in 12 months  Providers on your designated Care Team:   Christopher Berge, NP Ryan Dunn, PA-C Cadence Furth, PA-C  COVID-19 Vaccine Information can be found at: [email or web address] For questions related to vaccine distribution or appointments, please email [email or web address] or call [phone].   

## 2021-07-06 NOTE — Progress Notes (Signed)
Take care for ?  ?  ? ? ? ?Date:  07/06/2021  ? ?ID:  Monica Colon, DOB Aug 03, 1960, MRN [id number] ? ?Patient Location:  ?[address] ?[place] [address]  ? ?Provider location:   ?[place], [employer] office ? ?PCP:  Gustavo Lah, MD  ?Cardiologist:  [facility] ? ? ?Chief Complaint  ?Patient presents with  ? 12 month follow up   ?  "Doing well." Medications reviewed by the patient verbally.   ? ? ?History of Present Illness:   ? ?Monica Colon is a 61 y.o. female  past medical history of ?palpitations and fluttering in her chest dating back to 2009  ?hypertension,  ?hyperlipidemia ?previously seen for left arm pain.  ?Diagnosed with pericarditis in 2014 with abnormal EKG, diffuse T-wave abnormality, treated with colchicine and NSAIDs. ?Stress test  showing ejection fraction, no ischemia 2018 ?presents today for follow-up of her palpitations and chest pressure/pain ? ?Last seen by myself in clinic February 2022 ?Covid 10/2020 ? ?Having vertigo, started 11/22, seen in the ER ?Head MRI, normal ?Tried meclizine/diazepam ?Seen at [facility], audiology, [facility], ? ?[place] driving, has sx getting out of car ? ?No significant chest pain or shortness of breath ?Has not been going to the gym, less active secondary to vertigo ? ?EKG personally reviewed by myself on todays visit ?Normal sinus rhythm rate 68 bpm nonspecific ST abnormality, no change from prior EKGs ? ?Echocardiogram Feb 2022, normal ejection fraction ?Grade 2 diastolic dysfunction ? ? ?Lab Results  ?Component Value Date  ? CHOL 169 04/25/2020  ? HDL 60 04/25/2020  ? [place] 96 04/25/2020  ? TRIG 63 04/25/2020  ? ? ?CT neck and chest:  ?No atherosclerosis, CAD ? ?Other past medical hx ?Previously had chest pain symptoms radiating up in her clavicle and neck area.  went to [facility]. Workup in the emergency room with negative cardiac enzymes, negative chest x-ray. She was given GI cocktail. ?  ? Previously seen in the emergency room at [facility]. Potassium was 3.1. She had a second visit to [facility]. Over the course of her 2 visits, she had significant testing including stress echo that showed no wall motion abnormality, chest x-rays, chest CT scan showing left greater than right pleural effusion with small pericardial effusion, atelectasis the left, neck CTA which was essentially normal. Notes indicate she was instructed to take anti-inflammatories ?  ?Initial EKG at [facility] August 20 13,014 showed no significant ST-T wave changes ?Potassium at [facility] was 3.3 ?  ?Other blood work of note  at [facility] included elevated ESR and CRP. CRP was 16, sedimentation rate 100. Normal cardiac enzymes ?  ? ?Past Medical History:  ?Diagnosis Date  ? Heart murmur   ? at age 61  ? Hyperlipidemia   ? Hypertension   ? ?Past Surgical History:  ?Procedure Laterality Date  ? COLONOSCOPY    ? TONSILLECTOMY    ? TRIGGER FINGER RELEASE Right   ? UPPER GI ENDOSCOPY    ?  ? ?Current Meds  ?Medication Sig  ? aspirin EC 81 MG tablet Take 1 tablet (81 mg total) by mouth daily. Take after 12 weeks for prevention of preeclampsia later in pregnancy  ? Biotin 5 MG TABS Take 1 tablet by mouth daily.  ? cholecalciferol (VITAMIN D3) 25 MCG (1000 UNIT) tablet Take 5,000 Units by mouth daily.  ? Cod Liver Oil 5000-500 UNIT/5ML OIL Take by mouth.  ? ezetimibe-simvastatin (VYTORIN) 10-10 MG tablet  Take 0.5 tablets by mouth daily.  ? fish oil-omega-3 fatty acids 1000 MG capsule Take 2 g by mouth daily.  ? hydrochlorothiazide (MICROZIDE) 12.5 MG capsule Take 1 capsule (12.5 mg total) by mouth daily.  ? linaclotide (LINZESS) 290 MCG CAPS capsule Take 290 mcg by mouth daily.  ? losartan (COZAAR) 100 MG tablet Take 100 mg by mouth daily.  ? Multiple Vitamin (MULTIVITAMIN) capsule Take 1 capsule by mouth daily.  ? omeprazole (PRILOSEC) 40 MG capsule Take 1 capsule (40 mg total) by mouth daily as needed.  ? Probiotic Product (ALIGN) 4 MG CAPS Take 1 tablet by mouth daily.  ? propranolol (INDERAL)  10 MG tablet Take 1 tablet (10 mg total) by mouth 3 (three) times daily as needed.  ?  ? ?Allergies:   Penicillins  ? ?Social History  ? ?Tobacco Use  ? Smoking status: Never  ? Smokeless tobacco: Never  ?Vaping Use  ? Vaping Use: Never used  ?Substance Use Topics  ? Alcohol use: Yes  ?  Alcohol/week: 1.0 standard drink  ?  Types: 1 Glasses of wine per week  ?  Comment: social drinker.  ? Drug use: No  ?  ? ? ?Family Hx: ?The patient's family history includes Breast cancer in her paternal aunt; Hyperlipidemia in her mother; Hypertension in her mother. ? ?ROS:   ?Please see the history of present illness.    ?Review of Systems  ?Constitutional: Negative.   ?HENT: Negative.    ?Respiratory: Negative.    ?Cardiovascular: Negative.   ?Gastrointestinal: Negative.   ?Musculoskeletal: Negative.   ?Neurological:  Positive for dizziness.  ?Psychiatric/Behavioral: Negative.    ?All other systems reviewed and are negative.  ? ?Labs/Other Tests and Data Reviewed:   ? ?Recent Labs: ?02/12/2021: ALT 21 ?02/13/2021: BUN 12; Creatinine, Ser 0.64; Hemoglobin 13.0; Platelets 342; Potassium 3.6; Sodium 135  ? ?Recent Lipid Panel ?Lab Results  ?Component Value Date/Time  ? CHOL 169 04/25/2020 03:42 PM  ? TRIG 63 04/25/2020 03:42 PM  ? HDL 60 04/25/2020 03:42 PM  ? CHOLHDL 2.8 04/25/2020 03:42 PM  ? [place] 96 04/25/2020 03:42 PM  ? ? ?Wt Readings from Last 3 Encounters:  ?07/06/21 70.9 kg  ?02/12/21 68 kg  ?02/09/21 68 kg  ?  ? ?Exam:   ?BP 140/80 (BP Location: Left Arm, Patient Position: Sitting, Cuff Size: Normal)   Pulse 68   Ht 5' 5"  (1.651 m)   Wt 70.9 kg   SpO2 98%   BMI 26.00 kg/m?  ?Constitutional:  oriented to person, place, and time. No distress.  ?HENT:  ?Head: Grossly normal ?Eyes:  no discharge. No scleral icterus.  ?Neck: No JVD, no carotid bruits  ?Cardiovascular: Regular rate and rhythm, no murmurs appreciated ?Pulmonary/Chest: Clear to auscultation bilaterally, no wheezes or rails ?Abdominal: Soft.  no distension.   no tenderness.  ?Musculoskeletal: Normal range of motion ?Neurological:  normal muscle tone. Coordination normal. No atrophy ?Skin: Skin warm and dry ?Psychiatric: normal affect, pleasant ? ?ASSESSMENT & PLAN:   ? ?Chest pain, unspecified type ?No recent chest pain, no further work-up needed ? ?Benign positional vertigo ?Symptoms over the past several months, has seen specialists at [facility] ?Participated in PT, slowly improving ? ?Essential hypertension ?Blood pressure is well controlled on today's visit. No changes made to the medications.  Reports she is taking losartan 100 daily, HCTZ 25 daily ? ?Palpitations ?Taking propranolol prn ?Stable symptoms ? ?Hyperlipidemia ?Reports taking whole pill of her Vytorin 10/10 mg daily ? ?  Total encounter time more than 25 minutes ? Greater than 50% was spent in counseling and coordination of care with the patient ? ? ?Signed, ?Ida Rogue, MD  ?07/06/2021 3:26 PM    ?[facility] ?[address], [place], [place] [address] ? ? ? ? ?

## 2021-07-20 ENCOUNTER — Ambulatory Visit: Payer: BC Managed Care – PPO | Admitting: Cardiovascular Disease

## 2021-09-23 ENCOUNTER — Other Ambulatory Visit: Payer: Self-pay | Admitting: Cardiovascular Disease

## 2021-11-05 ENCOUNTER — Encounter: Payer: Self-pay | Admitting: Medical

## 2021-11-05 ENCOUNTER — Ambulatory Visit (INDEPENDENT_AMBULATORY_CARE_PROVIDER_SITE_OTHER): Payer: BC Managed Care – PPO | Admitting: Medical

## 2021-11-05 VITALS — BP 148/90 | HR 68 | Ht 65.0 in | Wt 157.0 lb

## 2021-11-05 DIAGNOSIS — R42 Dizziness and giddiness: Secondary | ICD-10-CM

## 2021-11-05 DIAGNOSIS — H8110 Benign paroxysmal vertigo, unspecified ear: Secondary | ICD-10-CM

## 2021-11-05 DIAGNOSIS — R079 Chest pain, unspecified: Secondary | ICD-10-CM | POA: Diagnosis not present

## 2021-11-05 DIAGNOSIS — E782 Mixed hyperlipidemia: Secondary | ICD-10-CM

## 2021-11-05 DIAGNOSIS — I10 Essential (primary) hypertension: Secondary | ICD-10-CM | POA: Diagnosis not present

## 2021-11-05 DIAGNOSIS — R002 Palpitations: Secondary | ICD-10-CM | POA: Diagnosis not present

## 2021-11-05 NOTE — Progress Notes (Signed)
Cardiology Office Note:    Date:  11/06/2021   ID:  Monica Colon, DOB 09-03-1960, MRN [id number]  PCP:  Lynwood Dawley, MD  [facility] Cardiologist:  Julien Nordmann, MD  [facility] Electrophysiologist:  None   Referring MD: Lynwood Dawley, MD   Chief Complaint: vertigo follow-up  History of Present Illness:    Monica Colon is a 61 y.o. female with a hx of palpitations, hypertension, hyperlipidemia, pericarditis in 2014 treated with colchicine and NSAIDs, and who presents for follow-up.   Previous cardiac work-up includes stress echo at [facility] 12/2012 -normal stress test, trivial pericardial effusion. 03/2017 stress test performed without significant ischemia.  Seen 04/2020 for atypical chest pain. Echo was ordered. HS trop was negative. Echo showed LVEF 60-65%, no WMA, G2DD, mild MR.   She was seen in the ER at [facility] for vertigo 02/2021. MRI of the head was normal. She followed up as OP with [facility].   Last seen 07/06/21 and was overall stable from a cardiac perspective.   Today, the patient reports she had recent US study at [facility] that showed disturbed flow in the left subclavian, normal carotids and normal vertebral arteries. She is still undergoing vertigo work-up at [facility]. She is still having symptoms like the room is spinning. Also has headaches. She denies chest pain, SOB, palpitations. She has occasional LLE, orthopnea or pnd.   Past Medical History:  Diagnosis Date   Heart murmur    at age 29   Hyperlipidemia    Hypertension     Past Surgical History:  Procedure Laterality Date   COLONOSCOPY     TONSILLECTOMY     TRIGGER FINGER RELEASE Right    UPPER GI ENDOSCOPY      Current Medications: Current Meds  Medication Sig   aspirin EC 81 MG tablet Take 81 mg by mouth. Takes 1 tablet 4-5 days per week Swallow whole.   Biotin 5 MG TABS Take 1 tablet by mouth. occassionally   cholecalciferol (VITAMIN D3) 25 MCG (1000 UNIT) tablet Take 5,000  Units by mouth. Takes several days per week   Cod Liver Oil 5000-500 UNIT/5ML OIL Take by mouth. Take several days per week   ezetimibe-simvastatin (VYTORIN) 10-10 MG tablet Take 1 tablet by mouth daily.   fish oil-omega-3 fatty acids 1000 MG capsule Take 2 g by mouth daily.   hydrochlorothiazide (MICROZIDE) 12.5 MG capsule TAKE 1 CAPSULE(12.5 MG) BY MOUTH DAILY   linaclotide (LINZESS) 290 MCG CAPS capsule Take 290 mcg by mouth daily as needed.   losartan (COZAAR) 100 MG tablet Take 100 mg by mouth daily.   Multiple Vitamin (MULTIVITAMIN) capsule Take 1 capsule by mouth daily.   omeprazole (PRILOSEC) 40 MG capsule Take 1 capsule (40 mg total) by mouth daily as needed.   Probiotic Product (ALIGN) 4 MG CAPS Take 1 tablet by mouth daily.   propranolol (INDERAL) 10 MG tablet Take 1 tablet (10 mg total) by mouth 3 (three) times daily as needed.     Allergies:   Lisinopril and Penicillins   Social History   Socioeconomic History   Marital status: Married    Spouse name: Not on file   Number of children: Not on file   Years of education: Not on file   Highest education level: Not on file  Occupational History   Not on file  Tobacco Use   Smoking status: Never   Smokeless tobacco: Never  Vaping Use   Vaping Use: Never  used  Substance and Sexual Activity   Alcohol use: Yes    Alcohol/week: 1.0 standard drink of alcohol    Types: 1 Glasses of wine per week    Comment: social drinker.   Drug use: No   Sexual activity: Not on file  Other Topics Concern   Not on file  Social History Narrative   Not on file   Social Determinants of Health   Financial Resource Strain: Not on file  Food Insecurity: Not on file  Transportation Needs: Not on file  Physical Activity: Not on file  Stress: Not on file  Social Connections: Not on file     Family History: The patient's family history includes Breast cancer in her paternal aunt; Hyperlipidemia in her mother; Hypertension in her  mother.  ROS:   Please see the history of present illness.     All other systems reviewed and are negative.  EKGs/Labs/Other Studies Reviewed:    The following studies were reviewed today:  09/03/21 [place] Final Interpretation  Right Carotid: There was no evidence of thrombus, dissection, atherosclerotic                 plaque or stenosis in the cervical carotid system.  Left Carotid: There was no evidence of thrombus, dissection, atherosclerotic                plaque or stenosis in the cervical carotid system.  Vertebrals:  Both vertebral arteries were patent with antegrade flow.  Subclavians: Left subclavian artery flow was disturbed. Normal flow hemodynamics               were seen in the right subclavian artery.  Electronically signed by [id number] Bennie Pierini MD on 09/04/2021 at 9:34:12 AM.   Echo 05/2020  1. Left ventricular ejection fraction, by estimation, is 60 to 65%. The  left ventricle has normal function. The left ventricle has no regional  wall motion abnormalities. Left ventricular diastolic parameters are  consistent with Grade II diastolic  dysfunction (pseudonormalization). The average left ventricular global  longitudinal strain is -15.7 %.   2. Right ventricular systolic function is normal. The right ventricular  size is normal.   3. The mitral valve is normal in structure. Mild mitral valve  regurgitation. No evidence of mitral stenosis.   Myoview Lexiscan 03/2017 Narrative & Impression  Exercise myocardial perfusion imaging study with no significant ischemia Normal wall motion, EF estimated at 80% Equivocal ST changes notes, 1 mm upsloping ST changes noted in anterolateral leads V4 to V6 at peak stress Low risk scan    EKG:  EKG is not ordered today.    Recent Labs: 02/12/2021: ALT 21 02/13/2021: BUN 12; Creatinine, Ser 0.64; Hemoglobin 13.0; Platelets 342; Potassium 3.6; Sodium 135  Recent Lipid Panel    Component Value Date/Time   CHOL 169 04/25/2020 1542    TRIG 63 04/25/2020 1542   HDL 60 04/25/2020 1542   CHOLHDL 2.8 04/25/2020 1542   VLDL 13 04/25/2020 1542   LDLCALC 96 04/25/2020 1542   Physical Exam:    VS:  BP (!) 148/90 (BP Location: Left Arm, Patient Position: Sitting, Cuff Size: Normal)   Pulse 68   Ht 5\' 5"  (1.651 m)   Wt 157 lb (71.2 kg)   SpO2 98%   BMI 26.13 kg/m     Wt Readings from Last 3 Encounters:  11/05/21 157 lb (71.2 kg)  07/06/21 156 lb 4 oz (70.9 kg)  02/12/21 150 lb (68 kg)  GEN:  Well nourished, well developed in no acute distress HEENT: Normal NECK: No JVD; No carotid bruits LYMPHATICS: No lymphadenopathy CARDIAC: RRR, no murmurs, rubs, gallops RESPIRATORY:  Clear to auscultation without rales, wheezing or rhonchi  ABDOMEN: Soft, non-tender, non-distended MUSCULOSKELETAL:  No edema; No deformity  SKIN: Warm and dry NEUROLOGIC:  Alert and oriented x 3 PSYCHIATRIC:  Normal affect   ASSESSMENT:    1. Vertigo   2. Chest pain, unspecified type   3. Benign paroxysmal positional vertigo, unspecified laterality   4. Essential hypertension   5. Palpitations   6. Hyperlipidemia, mixed    PLAN:    In order of problems listed above:  Vertigo She reports vertigo symptoms for about a year. She is undergoing work-up at [facility]. Recent US showed disturbed flow in the left subclavian, normal carotids and normal vertebral arteries. H/o vertigo for almost a year with vision changes, pressure in the head, vertigo worse with positioning. She is seeing neurology as well. Sleeping helps the head discomfort. Symptoms maybe secondary to BPPV/vestibular dysequilibrium.   Chest pain No chest pain reported. Echo 05/2020 showed normal LVEF, no WMA, G2DD, mild MR. No further work-up indicated at this time.   HTN BP is mildly elevated today. Continue current medications.   Palpitations No palpitations reported by the patient. No further work-up today.   HLD LDL 82. Continue zetia-simvastatin.  Disposition:  Follow up in 4 month(s) with MD/APP    Signed, Keishawn Rajewski Ninfa Meeker, PA-C  11/06/2021 7:46 AM    [facility]

## 2021-11-05 NOTE — Patient Instructions (Signed)
Medication Instructions:   Your physician recommends that you continue on your current medications as directed. Please refer to the Current Medication list given to you today.  *If you need a refill on your cardiac medications before your next appointment, please call your pharmacy*   Lab Work:  None ordered  Testing/Procedures:  None ordered   Follow-Up: At [facility], you and your health needs are our priority.  As part of our continuing mission to provide you with exceptional heart care, we have created designated Provider Care Teams.  These Care Teams include your primary Cardiologist (physician) and Advanced Practice Providers (APPs -  Physician Assistants and Nurse Practitioners) who all work together to provide you with the care you need, when you need it.  We recommend signing up for the patient portal called "MyChart".  Sign up information is provided on this After Visit Summary.  MyChart is used to connect with patients for Virtual Visits (Telemedicine).  Patients are able to view lab/test results, encounter notes, upcoming appointments, etc.  Non-urgent messages can be sent to your provider as well.   To learn more about what you can do with MyChart, go to [email or web address].    Your next appointment:   4 month(s)  The format for your next appointment:   In Person  Provider:   You may see Julien Nordmann, MD or one of the following Advanced Practice Providers on your designated Care Team:   Nicolasa Ducking, NP Eula Listen, PA-C Cadence Fransico Michael, [place]    Other Instructions  You have been referred to Dr. Lorine Bears for left subclavian stenosis  Important Information About Sugar

## 2021-12-01 ENCOUNTER — Encounter: Payer: Self-pay | Admitting: Cardiovascular Disease

## 2021-12-01 ENCOUNTER — Ambulatory Visit: Payer: BC Managed Care – PPO | Attending: Cardiovascular Disease | Admitting: Cardiovascular Disease

## 2021-12-01 VITALS — BP 142/86 | HR 63 | Ht 65.0 in | Wt 156.8 lb

## 2021-12-01 DIAGNOSIS — R42 Dizziness and giddiness: Secondary | ICD-10-CM | POA: Diagnosis not present

## 2021-12-01 DIAGNOSIS — I10 Essential (primary) hypertension: Secondary | ICD-10-CM | POA: Diagnosis not present

## 2021-12-01 DIAGNOSIS — R002 Palpitations: Secondary | ICD-10-CM

## 2021-12-01 NOTE — Patient Instructions (Signed)
Medication Instructions:  NONE *If you need a refill on your cardiac medications before your next appointment, please call your pharmacy*   Lab Work: NONE If you have labs (blood work) drawn today and your tests are completely normal, you will receive your results only by: MyChart Message (if you have MyChart) OR A paper copy in the mail If you have any lab test that is abnormal or we need to change your treatment, we will call you to review the results.   Testing/Procedures: NONE   Follow-Up: At [facility], you and your health needs are our priority.  As part of our continuing mission to provide you with exceptional heart care, we have created designated Provider Care Teams.  These Care Teams include your primary Cardiologist (physician) and Advanced Practice Providers (APPs -  Physician Assistants and Nurse Practitioners) who all work together to provide you with the care you need, when you need it.  We recommend signing up for the patient portal called "MyChart".  Sign up information is provided on this After Visit Summary.  MyChart is used to connect with patients for Virtual Visits (Telemedicine).  Patients are able to view lab/test results, encounter notes, upcoming appointments, etc.  Non-urgent messages can be sent to your provider as well.   To learn more about what you can do with MyChart, go to [email or web address].    Your next appointment:    Follow up as needed  The format for your next appointment:   In Person  Provider:   You may see Julien Nordmann, MD or one of the following Advanced Practice Providers on your designated Care Team:   Nicolasa Ducking, NP Eula Listen, PA-C Cadence Fransico Michael, PA-C Charlsie Quest, NP  Important Information About Sugar

## 2021-12-01 NOTE — Progress Notes (Signed)
Cardiology Office Note   Date:  12/01/2021   ID:  Monica Colon, DOB 1961/02/22, MRN [id number]  PCP:  Lynwood Dawley, MD  Cardiologist:  Dr. Mariah Milling  No chief complaint on file.     History of Present Illness: Monica Colon is a 61 y.o. female who was referred by Wanda Plump for evaluation possible left subclavian artery stenosis. The denies history of palpitations, essential hypertension, hyperlipidemia and previous pericarditis.  She has been dealing with vertigo and was diagnosed with benign positional vertigo.  She underwent carotid Doppler at [facility] and she is concerned about possible left subclavian artery stenosis as an etiology for her vertigo.  I reviewed the report but the images are not available.  According to the report, there was no evidence of atherosclerosis or stenosis in the carotid arteries.  Vertebral artery flow was antegrade bilaterally.  The flow in the left subclavian artery was described as disturbed but no evidence of stenosis.  She denies left arm claudication.  Her dizziness and vertigo is not related to her arm use.  No chest pain or shortness of breath.   Past Medical History:  Diagnosis Date   Heart murmur    at age 12   Hyperlipidemia    Hypertension     Past Surgical History:  Procedure Laterality Date   COLONOSCOPY     TONSILLECTOMY     TRIGGER FINGER RELEASE Right    UPPER GI ENDOSCOPY       Current Outpatient Medications  Medication Sig Dispense Refill   aspirin EC 81 MG tablet Take 81 mg by mouth. Takes 1 tablet 4-5 days per week Swallow whole.     Biotin 5 MG TABS Take 1 tablet by mouth. occassionally     cholecalciferol (VITAMIN D3) 25 MCG (1000 UNIT) tablet Take 5,000 Units by mouth. Takes several days per week     Cod Liver Oil 5000-500 UNIT/5ML OIL Take by mouth. Take several days per week     ezetimibe-simvastatin (VYTORIN) 10-10 MG tablet Take 1 tablet by mouth daily. 90 tablet 3   fish oil-omega-3 fatty acids  1000 MG capsule Take 2 g by mouth daily.     hydrochlorothiazide (MICROZIDE) 12.5 MG capsule TAKE 1 CAPSULE(12.5 MG) BY MOUTH DAILY 90 capsule 1   linaclotide (LINZESS) 290 MCG CAPS capsule Take 290 mcg by mouth daily as needed.     losartan (COZAAR) 100 MG tablet Take 100 mg by mouth daily.     Multiple Vitamin (MULTIVITAMIN) capsule Take 1 capsule by mouth daily.     omeprazole (PRILOSEC) 40 MG capsule Take 1 capsule (40 mg total) by mouth daily as needed. 30 capsule 3   Probiotic Product (ALIGN) 4 MG CAPS Take 1 tablet by mouth daily.     propranolol (INDERAL) 10 MG tablet Take 1 tablet (10 mg total) by mouth 3 (three) times daily as needed. 90 tablet 3   No current facility-administered medications for this visit.    Allergies:   Lisinopril and Penicillins    Social History:  The patient  reports that she has never smoked. She has never used smokeless tobacco. She reports current alcohol use of about 1.0 standard drink of alcohol per week. She reports that she does not use drugs.   Family History:  The patient's family history includes Breast cancer in her paternal aunt; Hyperlipidemia in her mother; Hypertension in her mother.    ROS:  Please see the history of present  illness.   Otherwise, review of systems are positive for none.   All other systems are reviewed and negative.    PHYSICAL EXAM: VS:  BP (!) 142/86   Pulse 63   Ht 5\' 5"  (1.651 m)   Wt 156 lb 12.8 oz (71.1 kg)   SpO2 98%   BMI 26.09 kg/m  , BMI Body mass index is 26.09 kg/m. GEN: Well nourished, well developed, in no acute distress  HEENT: normal  Neck: no JVD, carotid bruits, or masses Cardiac: RRR; no murmurs, rubs, or gallops,no edema  Respiratory:  clear to auscultation bilaterally, normal work of breathing GI: soft, nontender, nondistended, + BS MS: no deformity or atrophy  Skin: warm and dry, no rash Neuro:  Strength and sensation are intact Psych: euthymic mood, full affect Vascular: Her pulses are  equal and normal in both arms and legs.  No bruits   EKG:  EKG is not ordered today.    Recent Labs: 02/12/2021: ALT 21 02/13/2021: BUN 12; Creatinine, Ser 0.64; Hemoglobin 13.0; Platelets 342; Potassium 3.6; Sodium 135    Lipid Panel    Component Value Date/Time   CHOL 169 04/25/2020 1542   TRIG 63 04/25/2020 1542   HDL 60 04/25/2020 1542   CHOLHDL 2.8 04/25/2020 1542   VLDL 13 04/25/2020 1542   LDLCALC 96 04/25/2020 1542      Wt Readings from Last 3 Encounters:  12/01/21 156 lb 12.8 oz (71.1 kg)  11/05/21 157 lb (71.2 kg)  07/06/21 156 lb 4 oz (70.9 kg)            No data to display            ASSESSMENT AND PLAN:  1.  Benign positional vertigo: I do not see a vascular etiology for her symptoms.  Specifically, she does not have evidence of left subclavian artery stenosis.  Her pulses are equal in both arms and blood pressure is equal.  In addition, there is no evidence of subclavian steal.  Vertebral artery flow was antegrade bilaterally.  I explained to her that the disturbed flow in the left subclavian artery described on carotid Doppler is very nonspecific and does not indicate a stenosis.  No further vascular work-up is recommended.    Disposition:   Follow-up with me as needed.  Signed,  09/05/21, MD  12/01/2021 4:46 PM    [facility]

## 2022-02-09 ENCOUNTER — Other Ambulatory Visit: Payer: Self-pay | Admitting: Student

## 2022-02-09 DIAGNOSIS — Z1231 Encounter for screening mammogram for malignant neoplasm of breast: Secondary | ICD-10-CM

## 2022-03-07 NOTE — Progress Notes (Signed)
Date:  03/08/2022   ID:  Monica Colon, DOB [birth year], MRN [id number]  Patient Location:  [address] [place] [address]   Provider location:   [facility], [place] office  PCP:  Lynwood Dawley, MD  Cardiologist:  Hubbard Robinson [facility]   Chief Complaint  Patient presents with   4 month follow up     Patient c/o mid-sternum pain for the past couple of days. Medications reviewed by the patient verbally.     History of Present Illness:    Monica Colon is a 61 y.o. female  past medical history of palpitations and fluttering in her chest dating back to 2009  hypertension,  hyperlipidemia previously seen for left arm pain.  Diagnosed with pericarditis in 2014 with abnormal EKG, diffuse T-wave abnormality, treated with colchicine and NSAIDs. Stress test  showing ejection fraction, no ischemia 2018 presents today for follow-up of her palpitations and chest pressure/pain  Last seen by myself in clinic April 2023 Recent atypical chest discomfort, went away without intervention  Activity limited Covid 10/2020 Having vertigo, started 11/22, seen in the ER Head MRI, normal Tried meclizine/diazepam Going to [place], audiology, [facility], Still with residual sx  No exercise On whole simva/zetia  Labs reviewed: Total chol 151, LDL 82  EKG personally reviewed by myself on todays visit Normal sinus rhythm rate 62 bpm nonspecific ST abnormality, no change from prior EKGs  Echocardiogram Feb 2022, normal ejection fraction Grade 2 diastolic dysfunction   Lab Results  Component Value Date   CHOL 169 04/25/2020   HDL 60 04/25/2020   LDLCALC 96 04/25/2020   TRIG 63 04/25/2020   CT neck and chest:  No atherosclerosis, CAD  Previously had chest pain symptoms radiating up in her clavicle and neck area.  went to [facility]. Workup in the emergency room with negative cardiac enzymes, negative chest x-ray. She was given GI cocktail.    Previously  seen in the emergency room at [facility]. Potassium was 3.1. She had a second visit to [facility] Over the course of her 2 visits, she had significant testing including stress echo that showed no wall motion abnormality, chest x-rays, chest CT scan showing left greater than right pleural effusion with small pericardial effusion, atelectasis the left, neck CTA which was essentially normal. Notes indicate she was instructed to take anti-inflammatories   Initial EKG at [facility] August 20 13,014 showed no significant ST-T wave changes Potassium at [facility] was 3.3   Other blood work of note  at [facility] hospital included elevated ESR and CRP. CRP was 16, sedimentation rate 100. Normal cardiac enzymes    Past Medical History:  Diagnosis Date   Heart murmur    at age 1   Hyperlipidemia    Hypertension    Past Surgical History:  Procedure Laterality Date   COLONOSCOPY     TONSILLECTOMY     TRIGGER FINGER RELEASE Right    UPPER GI ENDOSCOPY       Current Meds  Medication Sig   acetaZOLAMIDE ER (DIAMOX) 500 MG capsule Take by mouth.   aspirin EC 81 MG tablet Take 81 mg by mouth. Takes 1 tablet 4-5 days per week Swallow whole.   Biotin 5 MG TABS Take 1 tablet by mouth. occassionally   cholecalciferol (VITAMIN D3) 25 MCG (1000 UNIT) tablet Take 5,000 Units by mouth. Takes several days per week   Cod Liver Oil 5000-500 UNIT/5ML OIL Take by mouth.  Take several days per week   ezetimibe-simvastatin (VYTORIN) 10-10 MG tablet Take 1 tablet by mouth daily.   fish oil-omega-3 fatty acids 1000 MG capsule Take 2 g by mouth daily.   hydrochlorothiazide (MICROZIDE) 12.5 MG capsule TAKE 1 CAPSULE(12.5 MG) BY MOUTH DAILY   linaclotide (LINZESS) 290 MCG CAPS capsule Take 290 mcg by mouth daily as needed.   losartan (COZAAR) 100 MG tablet Take 100 mg by mouth daily.   magnesium oxide (MAG-OX) 400 (240 Mg) MG tablet Take 400-600 mg daily   Multiple Vitamin (MULTIVITAMIN) capsule Take 1 capsule by mouth daily.    omeprazole (PRILOSEC) 40 MG capsule Take 1 capsule (40 mg total) by mouth daily as needed.   Probiotic Product (ALIGN) 4 MG CAPS Take 1 tablet by mouth daily.   propranolol (INDERAL) 10 MG tablet Take 1 tablet (10 mg total) by mouth 3 (three) times daily as needed.    Allergies:   Lisinopril and Penicillins   Social History   Tobacco Use   Smoking status: Never   Smokeless tobacco: Never  Vaping Use   Vaping Use: Never used  Substance Use Topics   Alcohol use: Yes    Alcohol/week: 1.0 standard drink of alcohol    Types: 1 Glasses of wine per week    Comment: social drinker.   Drug use: No     Family Hx: The patient's family history includes Breast cancer in her paternal aunt; Hyperlipidemia in her mother; Hypertension in her mother.  ROS:   Please see the history of present illness.    Review of Systems  Constitutional: Negative.   HENT: Negative.    Respiratory: Negative.    Cardiovascular: Negative.   Gastrointestinal: Negative.   Musculoskeletal: Negative.   Neurological:  Positive for dizziness.  Psychiatric/Behavioral: Negative.    All other systems reviewed and are negative.    Labs/Other Tests and Data Reviewed:    Recent Labs: No results found for requested labs within last 365 days.   Recent Lipid Panel Lab Results  Component Value Date/Time   CHOL 169 04/25/2020 03:42 PM   TRIG 63 04/25/2020 03:42 PM   HDL 60 04/25/2020 03:42 PM   CHOLHDL 2.8 04/25/2020 03:42 PM   LDLCALC 96 04/25/2020 03:42 PM    Wt Readings from Last 3 Encounters:  03/08/22 150 lb (68 kg)  12/01/21 156 lb 12.8 oz (71.1 kg)  11/05/21 157 lb (71.2 kg)     Exam:   BP 132/80 (BP Location: Left Arm, Patient Position: Sitting, Cuff Size: Normal)   Pulse 62   Ht 5\' 5"  (1.651 m)   Wt 150 lb (68 kg)   SpO2 98%   BMI 24.96 kg/m  Constitutional:  oriented to person, place, and time. No distress.  HENT:  Head: Grossly normal Eyes:  no discharge. No scleral icterus.  Neck: No JVD,  no carotid bruits  Cardiovascular: Regular rate and rhythm, no murmurs appreciated Pulmonary/Chest: Clear to auscultation bilaterally, no wheezes or rails Abdominal: Soft.  no distension.  no tenderness.  Musculoskeletal: Normal range of motion Neurological:  normal muscle tone. Coordination normal. No atrophy Skin: Skin warm and dry Psychiatric: normal affect, pleasant  ASSESSMENT & PLAN:    Chest pain, unspecified type Sternum chest pain past several days Resolved without intervention, no further work-up needed at this time  Benign positional vertigo followed by ENT,  [facility] Participating in PT  Essential hypertension osartan 100 daily, HCTZ 25 daily Blood pressure is well controlled on  today's visit. No changes made to the medications.  Palpitations Taking propranolol prn Minimal symptoms  Hyperlipidemia  taking whole Vytorin 10/10 mg daily Numbers well controlled   Total encounter time more than 30 minutes  Greater than 50% was spent in counseling and coordination of care with the patient   Signed, Julien Nordmann, MD  03/08/2022 3:09 PM    [facility] [address], [place], [place] [address]

## 2022-03-08 ENCOUNTER — Ambulatory Visit: Payer: BC Managed Care – PPO | Attending: Cardiovascular Disease | Admitting: Cardiovascular Disease

## 2022-03-08 ENCOUNTER — Encounter: Payer: Self-pay | Admitting: Cardiovascular Disease

## 2022-03-08 ENCOUNTER — Other Ambulatory Visit: Payer: Self-pay

## 2022-03-08 VITALS — BP 132/80 | HR 62 | Ht 65.0 in | Wt 150.0 lb

## 2022-03-08 DIAGNOSIS — I10 Essential (primary) hypertension: Secondary | ICD-10-CM

## 2022-03-08 DIAGNOSIS — R002 Palpitations: Secondary | ICD-10-CM | POA: Diagnosis not present

## 2022-03-08 DIAGNOSIS — E782 Mixed hyperlipidemia: Secondary | ICD-10-CM | POA: Diagnosis not present

## 2022-03-08 DIAGNOSIS — R079 Chest pain, unspecified: Secondary | ICD-10-CM | POA: Diagnosis not present

## 2022-03-08 MED ORDER — PROPRANOLOL HCL 10 MG PO TABS: 10.0000 mg | ORAL_TABLET | Freq: Three times a day (TID) | ORAL | 0 refills | Status: DC | PRN

## 2022-03-08 NOTE — Patient Instructions (Signed)
Medication Instructions:  No changes  If you need a refill on your cardiac medications before your next appointment, please call your pharmacy.   Lab work: No new labs needed  Testing/Procedures: No new testing needed  Follow-Up: At [facility], you and your health needs are our priority.  As part of our continuing mission to provide you with exceptional heart care, we have created designated Provider Care Teams.  These Care Teams include your primary Cardiologist (physician) and Advanced Practice Providers (APPs -  Physician Assistants and Nurse Practitioners) who all work together to provide you with the care you need, when you need it.  You will need a follow up appointment in 12 months  Providers on your designated Care Team:   Christopher Berge, NP Ryan Dunn, PA-C Cadence Furth, PA-C  COVID-19 Vaccine Information can be found at: [email or web address] For questions related to vaccine distribution or appointments, please email [email or web address] or call [phone].   

## 2022-04-16 ENCOUNTER — Other Ambulatory Visit: Payer: Self-pay | Admitting: Cardiovascular Disease

## 2022-05-03 ENCOUNTER — Ambulatory Visit: Payer: BC Managed Care – PPO

## 2022-06-18 ENCOUNTER — Ambulatory Visit
Admission: RE | Admit: 2022-06-18 | Discharge: 2022-06-18 | Disposition: A | Discharge status: Home / Self Care | Payer: BC Managed Care – PPO | Source: Ambulatory Visit | Attending: Student | Admitting: Student

## 2022-06-18 DIAGNOSIS — Z1231 Encounter for screening mammogram for malignant neoplasm of breast: Secondary | ICD-10-CM

## 2023-01-20 ENCOUNTER — Encounter: Payer: Self-pay | Admitting: Student

## 2023-01-20 ENCOUNTER — Ambulatory Visit: Payer: BC Managed Care – PPO | Attending: Student | Admitting: Student

## 2023-01-20 ENCOUNTER — Telehealth: Payer: Self-pay | Admitting: Cardiovascular Disease

## 2023-01-20 VITALS — BP 160/88 | HR 59 | Ht 65.0 in | Wt 151.0 lb

## 2023-01-20 DIAGNOSIS — R42 Dizziness and giddiness: Secondary | ICD-10-CM

## 2023-01-20 DIAGNOSIS — R002 Palpitations: Secondary | ICD-10-CM | POA: Diagnosis not present

## 2023-01-20 DIAGNOSIS — I10 Essential (primary) hypertension: Secondary | ICD-10-CM | POA: Diagnosis not present

## 2023-01-20 MED ORDER — OLMESARTAN MEDOXOMIL 20 MG PO TABS: 20.0000 mg | ORAL_TABLET | Freq: Every day | ORAL | 0 refills | Status: DC

## 2023-01-20 NOTE — Patient Instructions (Addendum)
Medication Instructions:  STOP the Losartan  START Olmesartan 20 mg once daily (see additional instructions below)  *If you need a refill on your cardiac medications before your next appointment, please call your pharmacy*   Lab Work: Your provider would like for you to return in 2 weeks to have the following labs drawn: BMET.   Please go to [facility] [address] ([facility]) [address], [place] [address] You do not need an appointment.  They are open from 7:30 am-4 pm.  Lunch from 1:00 pm- 2:00 pm You will not need to be fasting.   If you have labs (blood work) drawn today and your tests are completely normal, you will receive your results only by: MyChart Message (if you have MyChart) OR A paper copy in the mail If you have any lab test that is abnormal or we need to change your treatment, we will call you to review the results.   Testing/Procedures: None ordered   Follow-Up: At [facility], you and your health needs are our priority.  As part of our continuing mission to provide you with exceptional heart care, we have created designated Provider Care Teams.  These Care Teams include your primary Cardiologist (physician) and Advanced Practice Providers (APPs -  Physician Assistants and Nurse Practitioners) who all work together to provide you with the care you need, when you need it.  We recommend signing up for the patient portal called "MyChart".  Sign up information is provided on this After Visit Summary.  MyChart is used to connect with patients for Virtual Visits (Telemedicine).  Patients are able to view lab/test results, encounter notes, upcoming appointments, etc.  Non-urgent messages can be sent to your provider as well.   To learn more about what you can do with MyChart, go to [email or web address].    Your next appointment:   3 month(s)  Provider:   You may see Julien Nordmann, MD or one of the following Advanced Practice  Providers on your designated Care Team:    -Carlos Levering, NP Other Instructions We would like you to check your blood pressure daily for the next week.  Keep a journal of these daily blood pressure and heart rate readings and send a MyChart message. If your blood pressure in consistently staying over 130/80 then increase your Olmsertan to 40 mg once daily. Send a MyChart message at week one and week 2 with your blood pressure and heart rate readings.   It is best to check your BP 1-2 hours after taking your medications to see the medications effectiveness on your BP.    Here are some tips that our clinical pharmacists share for home BP monitoring:          Rest 10 minutes before taking your blood pressure.          Don't smoke or drink caffeinated beverages for at least 30 minutes before.          Take your blood pressure before (not after) you eat.          Sit comfortably with your back supported and both feet on the floor (don't cross your legs).          Elevate your arm to heart level on a table or a desk.          Use the proper sized cuff. It should fit smoothly and snugly around your bare upper arm. There should be enough room to slip a fingertip under the  cuff. The bottom edge of the cuff should be 1 inch above the crease of the elbow.

## 2023-01-20 NOTE — Telephone Encounter (Signed)
Called patient, advised that for the last two weeks she has been having some elevated readings. She states the only symptoms she has is a headache and feeling dizzy at times. She was concerned yesterday as her blood pressure was elevated even with medications and was not coming down. She called on call provider and did not hear back, so she took another Losartan 100 mg last night and the blood pressures came down. She rechecked first thing this morning and it was 157/87, states her HR runs around 49-54 normally. She did recheck the BP while on the phone with me 149/99 HR 69. She has not had any morning medications at this time, advised her to go ahead and take her normal medications, scheduled an appointment for this afternoon at 1:55 PM with the NP. Patient verbalized understanding.   Thanks!

## 2023-01-20 NOTE — Telephone Encounter (Signed)
Pt c/o BP issue: STAT if pt c/o blurred vision, one-sided weakness or slurred speech  1. What are your last 5 BP readings?  149/82 157/87 162/80's 136/67 176/96  2. Are you having any other symptoms (ex. Dizziness, headache, blurred vision, passed out)?  Dizziness   3. What is your BP issue?  BP is high. Patient states she took an extra BP pill last night (Losartan 100 MG).

## 2023-01-20 NOTE — Progress Notes (Signed)
Cardiology Clinic Note   Date: 01/20/2023 ID: [name], DOB 11/04/1960, MRN [id number]  Primary Cardiologist:  Julien Nordmann, MD  Patient Profile    Monica Colon is a 62 y.o. female who presents to the clinic today for evaluation of elevated BP.     Past medical history significant for: Hypertension.  Pericarditis in 2014.  Echo 05/08/2020: EF 60-65%. No RWMA. Grade II DD. Normal RV function. Mild MR. No evidence of pericardial effusion.  Hyperlipidemia. Lipid panel 06/04/2022: LDL 96, HDL 52, TG 115, total 171.   Palpitations. GERD.  Benign positional vertigo.      History of Present Illness    Monica Colon is followed by Dr. Mariah Milling for the above outlined history. In summary, patient has a history of pericarditis in 2014 treated with colchicine and NSAIDs. Previous cardiac workup at [facility] in September 2014 included a stress echo which showed normal stress and trivial pericardial effusion. Stress test December 2018 showed no significant ischemia. Seen for atypical chest pain in January 2022. Troponin negative. Echo showed normal LV/RV function, grade II DD, mild MR. She was seen at [facility] ED in November 2022 for vertigo. MRI was normal. At an August 2023 office visit she reported vertigo workup at [facility] included an [place] studied which showed disturbed flow in left subclavian, normal carotids, and normal vertebral arteries. She was evaluated by Dr. Kirke Corin for left subclavian artery stenosis. It was felt she was not showing any evidence of stenosis/subclavian steal with equal pulses and BP bilateral upper extremities. No further workup was recommended.   Patient was last seen in the office by Dr. Mariah Milling on 03/08/2022 with complaints of sternal chest pain that resolved on its own. No further workup indicated. BP was well controlled at that time.   Patient contacted the office today with complaints of elevated BP. Per triage RN: "Called patient, advised that for the last two  weeks she has been having some elevated readings. She states the only symptoms she has is a headache and feeling dizzy at times. She was concerned yesterday as her blood pressure was elevated even with medications and was not coming down. She called on call provider and did not hear back, so she took another Losartan 100 mg last night and the blood pressures came down. She rechecked first thing this morning and it was 157/87, states her HR runs around 49-54 normally. She did recheck the BP while on the phone with me 149/99 HR 69. She has not had any morning medications at this time, advised her to go ahead and take her normal medications."   Discussed the use of AI scribe software for clinical note transcription with the patient, who gave verbal consent to proceed.  The patient presents with a chief complaint of high blood pressure for 2 weeks. She reports feeling an increase in unsteadiness/dizziness that has been chronic for the past 2 years. She checked her BP because she felt bad and noted it was elevated. Since then it has persistently been elevated. She also reports associated headache but is not sure if this is related to the dizziness or her BP. Dizziness is related to movement. Of note, she received the Covid vaccine on 10/8. Her vertigo symptoms started 2 years ago after having Covid and she also had an increase in symptoms following a vaccine some time after that.  The patient also reports experiencing palpitations, described as brief but frequent episodes of an unusual heart sensation, which  started approximately two weeks ago. The palpitations are not associated with sleep or lying down.  She has experienced palpitations like this in the past but it seems to come more frequently. She takes propranolol as needed for the palpitations and headaches. She reports eating [place] food yesterday afternoon. She has not had anything to eat today. The patient admits to not drinking enough water, not getting  enough sleep, not following a healthy diet, and not exercising.          ROS: All other systems reviewed and are otherwise negative except as noted in History of Present Illness.  Studies Reviewed    EKG Interpretation Date/Time:  Thursday January 20 2023 13:54:21 EDT Ventricular Rate:  59 PR Interval:  126 QRS Duration:  70 QT Interval:  422 QTC Calculation: 417 R Axis:   9  Text Interpretation: Sinus bradycardia Low voltage QRS Nonspecific ST abnormality When compared with ECG of 03/08/2022 (not in Muse) no significant changes Confirmed by Carlos Levering [id number]) on 01/20/2023 2:11:41 PM   Risk Assessment/Calculations      HYPERTENSION CONTROL Vitals:   01/20/23 1350 01/20/23 1546  BP: (!) 164/92 (!) 160/88    The patient's blood pressure is elevated above target today.  In order to address the patient's elevated BP: A new medication was prescribed today.           Physical Exam    VS:  BP (!) 164/92   Pulse (!) 59   Ht 5\' 5"  (1.651 m)   Wt 151 lb (68.5 kg)   SpO2 94%   BMI 25.13 kg/m  , BMI Body mass index is 25.13 kg/m.  GEN: Well nourished, well developed, in no acute distress. Neck: No JVD or carotid bruits. Cardiac:  RRR. No murmurs. No rubs or gallops.   Respiratory:  Respirations regular and unlabored. Clear to auscultation without rales, wheezing or rhonchi. GI: Soft, nontender, nondistended. Extremities: Radials/DP/PT 2+ and equal bilaterally. No clubbing or cyanosis. No edema.  Skin: Warm and dry, no rash. Neuro: Strength intact.  Assessment & Plan       Hypertension Elevated blood pressure readings at home and in office. 164/92 on intake and 160/88 on my recheck. Discussed the impact of dietary sodium and hydration on blood pressure. Current treatment with Losartan not providing adequate control. -Discontinue Losartan and start Olmesartan 20mg  daily. -Check blood pressure daily, 2 hours post medication, for one week and report via  MyChart. -If blood pressure not under 130/80 after one week, increase Olmesartan to 40mg  daily and continue monitoring for another week. -Plan to recheck BMP in two weeks to assess kidney function. -If not adequately controlled on Olmesartan, will consider the addition of amlodipine.   Palpitations Recent onset of brief, frequent palpitations described as a "pause" in normal heart rhythm x 2 weeks. EKG similar to past results. Patient taking Propranolol 10mg  as needed. Similar palpitations in the past but not as frequent as recently.  -Continue Propranolol 10mg  as needed for palpitations. -Consider ordering heart monitor if palpitations persist or worsen.  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 Recent exacerbation potentially linked to hypertension and/or dehydration. Could also be from Covid vaccine as she has had an exacerbation following the Covid vaccine in the past.  -Increase hydration to 48-64 ounces daily. -Encouraged follow-up with neurology.  General Health Maintenance -Encouraged to increase hydration, improve sleep hygiene and maintain a balanced diet. -Advised to limit use of Ibuprofen due to potential impact on blood pressure.  Disposition: Stop losartan and begin Olmesartan 20 mg daily. If BP >130/80 after 1 week increase to 40 mg daily. MyChart readings after 2 weeks. BMP in 2 weeks. Return in 3 months or sooner as needed.          Signed, Etta Grandchild. Kristof Nadeem, DNP, NP-C

## 2023-02-21 ENCOUNTER — Other Ambulatory Visit
Admission: RE | Admit: 2023-02-21 | Discharge: 2023-02-21 | Disposition: A | Discharge status: Home / Self Care | Payer: BC Managed Care – PPO | Attending: Student | Admitting: Student

## 2023-02-21 DIAGNOSIS — I10 Essential (primary) hypertension: Secondary | ICD-10-CM | POA: Diagnosis present

## 2023-02-21 LAB — BASIC METABOLIC PANEL
Anion gap: 8 (ref 5–15)
BUN: 19 mg/dL (ref 8–23)
CO2: 29 mmol/L (ref 22–32)
Calcium: 9.2 mg/dL (ref 8.9–10.3)
Chloride: 99 mmol/L (ref 98–111)
Creatinine, Ser: 0.86 mg/dL (ref 0.44–1.00)
GFR, Estimated: >60 mL/min (ref >60)
Glucose, Bld: 97 mg/dL (ref 70–99)
Potassium: 3.7 mmol/L (ref 3.5–5.1)
Sodium: 136 mmol/L (ref 135–145)

## 2023-03-01 ENCOUNTER — Ambulatory Visit: Payer: BC Managed Care – PPO | Attending: Student

## 2023-03-01 DIAGNOSIS — R002 Palpitations: Secondary | ICD-10-CM

## 2023-03-08 DIAGNOSIS — R002 Palpitations: Secondary | ICD-10-CM | POA: Diagnosis not present

## 2023-04-16 ENCOUNTER — Other Ambulatory Visit: Payer: Self-pay | Admitting: Student

## 2023-04-22 NOTE — Progress Notes (Unsigned)
Cardiology Clinic Note   Date: 04/25/2023 ID: Chessica Gillean, DOB 01/19/1961, MRN [id number]  Primary Cardiologist:  Julien Nordmann, MD  Patient Profile    Monica Colon is a 63 y.o. female who presents to the clinic today for follow up after testing.     Past medical history significant for: Hypertension.  Pericarditis in 2014.  Echo 05/08/2020: EF 60-65%. No RWMA. Grade II DD. Normal RV function. Mild MR. No evidence of pericardial effusion.  Hyperlipidemia. Lipid panel 06/04/2022: LDL 96, HDL 52, TG 115, total 171.   Palpitations. 2-week Zio 03/31/2023: HR 46-118 bpm, average 61 bpm. Rare ectopy. Patient triggered events associated with NSR and rare PVCs.  GERD.  Benign positional vertigo.    In summary, patient has a history of pericarditis in 2014 treated with colchicine and NSAIDs. Previous cardiac workup at [facility] in September 2014 included a stress echo which showed normal stress and trivial pericardial effusion. Stress test December 2018 showed no significant ischemia. Seen for atypical chest pain in January 2022. Troponin negative. Echo showed normal LV/RV function, grade II DD, mild MR. She was seen at [facility] ED in November 2022 for vertigo. MRI was normal. At an August 2023 office visit she reported vertigo workup at [facility] included an [place] studied which showed disturbed flow in left subclavian, normal carotids, and normal vertebral arteries. She was evaluated by Dr. Kirke Corin for left subclavian artery stenosis. It was felt she was not showing any evidence of stenosis/subclavian steal with equal pulses and BP bilateral upper extremities. No further workup was recommended.     History of Present Illness    Monica Colon is followed by [facility] for the above outlined history.   Patient was last seen in the office by me on 01/20/2023 for concerns with elevated BP. She reported a 2 week history of elevated BP with readings as high as 157/87. She reported associated headache  and dizziness. She received Covid vaccine on 10/8. Vertigo symptoms started 2 years prior after Covid and had increased symptoms after vaccine. She also reported increased palpitations possibly related to vaccine. Patient was changed from Losartan to olmesartan.   Today, patient is doing well. She reports improved dizziness/vertigo since starting feverfew as instructed by ENT. She continues to have episodes of palpitations but that has improved as well. She has not taken propranolol for quite some time. BP has been better controlled since starting olmesartan. Discussed results of Zio monitor. Patient has been under increased stress related to her mom falling and breaking her arm. She has been staying nights with her mom in the nursing home and is not getting great sleep most nights.      ROS: All other systems reviewed and are otherwise negative except as noted in History of Present Illness.  EKGs/Labs Reviewed       02/21/2023: BUN 19; Creatinine, Ser 0.86; Potassium 3.7; Sodium 136   EKG is not ordered today.   Physical Exam    VS:  BP 128/80 (BP Location: Left Arm, Patient Position: Sitting, Cuff Size: Normal)   Pulse 65   Ht 5\' 5"  (1.651 m)   Wt 149 lb 8 oz (67.8 kg)   SpO2 98%   BMI 24.88 kg/m  , BMI Body mass index is 24.88 kg/m.  GEN: Well nourished, well developed, in no acute distress. Neck: No JVD or carotid bruits. Cardiac:  RRR. No murmurs. No rubs or gallops.   Respiratory:  Respirations regular and unlabored. Clear  to auscultation without rales, wheezing or rhonchi. GI: Soft, nontender, nondistended. Extremities: Radials/DP/PT 2+ and equal bilaterally. No clubbing or cyanosis. No edema.  Skin: Warm and dry, no rash. Neuro: Strength intact.  Assessment & Plan   Hypertension BP today 128/80. Patient reports home BP has been improved.  -Continue hydrochlorothiazide, olmesartan.  Dizziness/Vertigo Patient reports improved dizziness/vertigo since starting feverfew  per ENT. She still has episodes but feels more "normal."  -Continue to follow with ENT.   Palpitations 14 day Zio December 2024 showed rare ectopy. Patient reports improved palpitations since getting dizziness/vertigo under better control. She still has episodes of palpitations but she has not needed to take propranolol for a while. RRR on exam today without extrasystole.  -Continue as needed propranolol.   Disposition: Return in 6 months or sooner as needed.          Signed, Etta Grandchild. Husayn Reim, DNP, NP-C

## 2023-04-25 ENCOUNTER — Ambulatory Visit: Payer: 59 | Attending: Student | Admitting: Student

## 2023-04-25 ENCOUNTER — Encounter: Payer: Self-pay | Admitting: Student

## 2023-04-25 VITALS — BP 128/80 | HR 65 | Ht 65.0 in | Wt 149.5 lb

## 2023-04-25 DIAGNOSIS — H8110 Benign paroxysmal vertigo, unspecified ear: Secondary | ICD-10-CM

## 2023-04-25 DIAGNOSIS — R002 Palpitations: Secondary | ICD-10-CM | POA: Diagnosis not present

## 2023-04-25 DIAGNOSIS — I10 Essential (primary) hypertension: Secondary | ICD-10-CM | POA: Diagnosis not present

## 2023-04-25 DIAGNOSIS — R42 Dizziness and giddiness: Secondary | ICD-10-CM

## 2023-04-25 MED ORDER — PROPRANOLOL HCL 10 MG PO TABS: 10.0000 mg | ORAL_TABLET | Freq: Three times a day (TID) | ORAL | 0 refills | Status: AC | PRN

## 2023-04-25 MED ORDER — EZETIMIBE-SIMVASTATIN 10-10 MG PO TABS: 1.0000 | ORAL_TABLET | Freq: Every day | ORAL | 3 refills | Status: AC

## 2023-04-25 NOTE — Patient Instructions (Signed)
Medication Instructions:   Your physician recommends that you continue on your current medications as directed. Please refer to the Current Medication list given to you today.  *If you need a refill on your cardiac medications before your next appointment, please call your pharmacy*   Lab Work:  No labs ordered today   If you have labs (blood work) drawn today and your tests are completely normal, you will receive your results only by: MyChart Message (if you have MyChart) OR A paper copy in the mail If you have any lab test that is abnormal or we need to change your treatment, we will call you to review the results.   Testing/Procedures:  No test ordered today    Follow-Up: At [facility], you and your health needs are our priority.  As part of our continuing mission to provide you with exceptional heart care, we have created designated Provider Care Teams.  These Care Teams include your primary Cardiologist (physician) and Advanced Practice Providers (APPs -  Physician Assistants and Nurse Practitioners) who all work together to provide you with the care you need, when you need it.   Your next appointment:   6 month(s)  Provider:   Carlos Levering, NP

## 2023-07-22 ENCOUNTER — Other Ambulatory Visit: Payer: Self-pay | Admitting: Student

## 2023-07-22 DIAGNOSIS — Z0000 Encounter for general adult medical examination without abnormal findings: Secondary | ICD-10-CM

## 2023-08-09 ENCOUNTER — Ambulatory Visit
Admission: RE | Admit: 2023-08-09 | Discharge: 2023-08-09 | Disposition: A | Discharge status: Home / Self Care | Source: Ambulatory Visit | Attending: Student | Admitting: Student

## 2023-08-09 ENCOUNTER — Other Ambulatory Visit: Payer: Self-pay | Admitting: Student

## 2023-08-09 DIAGNOSIS — Z0000 Encounter for general adult medical examination without abnormal findings: Secondary | ICD-10-CM

## 2023-11-03 ENCOUNTER — Other Ambulatory Visit: Payer: Self-pay

## 2023-11-03 MED ORDER — OLMESARTAN MEDOXOMIL 20 MG PO TABS
20.0000 mg | ORAL_TABLET | Freq: Every day | ORAL | 1 refills | Status: DC
Start: 2023-11-03 — End: 2024-02-03

## 2023-11-09 NOTE — Progress Notes (Deleted)
 Cardiology Clinic Note   Date: 11/09/2023 ID: Monica Colon, DOB June 17, 1960, MRN [id number]  Primary Cardiologist:  Evalene Lunger, MD  Chief Complaint   Monica Colon is a 63 y.o. female who presents to the clinic today for ***  Patient Profile   Monica Colon Monica Colon is followed by *** for the history outlined below.      Past medical history significant for: Hypertension.  Pericarditis in 2014.  Echo 05/08/2020: EF 60-65%. No RWMA. Grade II DD. Normal RV function. Mild MR. No evidence of pericardial effusion.  Hyperlipidemia. Lipid panel 06/04/2022: LDL 96, HDL 52, TG 115, total 171.   Palpitations. 2-week Zio 03/31/2023: HR 46-118 bpm, average 61 bpm. Rare ectopy. Patient triggered events associated with NSR and rare PVCs.  GERD.  Benign positional vertigo.    In summary, patient has a history of pericarditis in 2014 treated with colchicine  and NSAIDs. Previous cardiac workup at [facility] in September 2014 included a stress echo which showed normal stress and trivial pericardial effusion. Stress test December 2018 showed no significant ischemia. Seen for atypical chest pain in January 2022. Troponin negative. Echo showed normal LV/RV function, grade II DD, mild MR. She was seen at [facility] ED in November 2022 for vertigo. MRI was normal. At an August 2023 office visit she reported vertigo workup at [facility] included an US  studied which showed disturbed flow in left subclavian, normal carotids, and normal vertebral arteries. She was evaluated by Dr. Darron for left subclavian artery stenosis. It was felt she was not showing any evidence of stenosis/subclavian steal with equal pulses and BP bilateral upper extremities. No further workup was recommended.    Patient was seen in the office on 01/20/2023 for concerns with elevated BP. She reported a 2 week history of elevated BP with readings as high as 157/87. She reported associated headache and dizziness. She received Covid vaccine on 10/8.  Vertigo symptoms started 2 years prior after Covid and had increased symptoms after vaccine. She also reported increased palpitations possibly related to vaccine. Patient was changed from Losartan  to olmesartan .   Patient was last seen in the office by me on 04/25/2023 for follow-up after testing.  She reported improved dizziness/vertigo since starting feverfew per ENT.  She continued to have episodes of palpitations that are better than previous and had not needed propranolol  for quite some time.  BP better controlled on olmesartan .  No medication changes were made.     History of Present Illness    Today, patient ***  Hypertension BP today***.  -Continue hydrochlorothiazide , olmesartan .   Dizziness/Vertigo Patient reports*** - Continue feverfew. - Continue to follow with ENT.    Palpitations 14 day Zio December 2024 showed rare ectopy. Patient*** -Continue as needed propranolol .   ROS: All other systems reviewed and are otherwise negative except as noted in History of Present Illness.  EKGs/Labs Reviewed        02/21/2023: BUN 19; Creatinine, Ser 0.86; Potassium 3.7; Sodium 136   No results found for requested labs within last 365 days.   No results found for requested labs within last 365 days.   No results found for requested labs within last 365 days.  ***  Risk Assessment/Calculations    {Does this patient have ATRIAL FIBRILLATION?:[phone]} No BP recorded.  {Refresh Note OR Click here to enter BP  :1}***        Physical Exam    VS:  There were no vitals taken for this visit. ,  BMI There is no height or weight on file to calculate BMI.  GEN: Well nourished, well developed, in no acute distress. Neck: No JVD or carotid bruits. Cardiac: *** RRR. *** No murmur. No rubs or gallops.   Respiratory:  Respirations regular and unlabored. Clear to auscultation without rales, wheezing or rhonchi. GI: Soft, nontender, nondistended. Extremities: Radials/DP/PT 2+ and  equal bilaterally. No clubbing or cyanosis. No edema ***  Skin: Warm and dry, no rash. Neuro: Strength intact.  Assessment & Plan   ***  Disposition: ***     {Are you ordering a CV Procedure (e.g. stress test, cath, DCCV, TEE, etc)?   Press F2        :[id number]}   Signed, Barnie HERO. Shawntay Prest, DNP, NP-C

## 2023-11-11 ENCOUNTER — Ambulatory Visit: Admitting: Student

## 2023-12-09 NOTE — Progress Notes (Signed)
 Chief Complaint:   No chief complaint on file.   Subjective:   Monica Colon is a 63 y.o. female who presents today to discuss:  Updates:  - Mom home after rehab stay, using bone stimulator; affects Jalesia's sleep - R great toe pain/numbness; has had work-up in the past, states was told has cyst  Dizziness: Chronic, intermittent Previously improved with fever few (took away the buzz) Balance issues still happening intermittently Wonders if COVID or COVID vaccines might have contributed Driving again Interested in resuming PT   HTN: Chronic, at goal Takes hydrochlorothiazide  12.5 mg daily, olmesartan  20 mg daily (previously was on losartan )   HLD: Chronic, not discussed today Takes Vytorin  daily   IBS/constipation: Chronic, not discussed today  Linzess  helps, needs refills     From prior SH: Trained as an Pensions consultant; 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 She lives with her husband Darin; they have no children. Helps care for aunt and mom.    PMH, FH, SH, meds, and allergies reviewed by me in Epic.  Patient Active Problem List  Diagnosis  . Essential hypertension  . History of pericarditis  . IBS (irritable bowel syndrome)  . Mixed hyperlipidemia  . Positive ANA (antinuclear antibody)  . Tendonitis, Achilles  . Trigger finger of right hand  . Palpitations  . Esophageal reflux  . Great toe pain, right  . Pleurisy  . Pericarditis (HHS-HCC)  . Hypokalemia  . Family history of stroke (cerebrovascular)  . Family history of other cardiovascular diseases(V17.49)  . Chest pain, atypical  . Atrophic vaginitis  . Allergic rhinitis  . Burning mouth syndrome  . BPV (benign positional vertigo)  . Pain of right thumb  . Trigger finger of right thumb  . Vestibular migraine    Outpatient Medications Prior to Visit  Medication Sig Dispense Refill  . aspirin  81 MG EC tablet Take 81 mg by mouth once daily    . Bifidobacterium infantis (ALIGN) 4 mg  capsule Take by mouth    . cholecalciferol (VITAMIN D3) 1000 unit tablet Take by mouth once daily    . co-enzyme Q-10, ubiquinone, (CO Q-10) 100 mg capsule Take 400 mg by mouth once daily    . cod liver oil Oil Take by mouth    . colchicine  (COLCRYS ) 0.6 mg tablet     . ezetimibe -simvastatin  (VYTORIN ) 10-10 mg tablet TAKE 1 TABLET BY MOUTH EVERY DAY 90 tablet 3  . hydroCHLOROthiazide  (MICROZIDE ) 12.5 mg capsule Take 1 capsule (12.5 mg total) by mouth once daily 90 capsule 3  . ipratropium (ATROVENT) 0.06 % nasal spray     . linaCLOtide  (LINZESS ) 290 mcg capsule Take 1 capsule (290 mcg total) by mouth once daily 30 capsule 11  . magnesium oxide (MAG-OX) 400 mg tablet Take 1 tablet (400 mg total) by mouth once daily as needed (leg cramps). 90 tablet 3  . mometasone (NASONEX) 50 mcg/actuation nasal spray Place 2 sprays into both nostrils once daily 17 g 3  . multivitamin capsule Take 1 capsule by mouth once daily    . olmesartan  (BENICAR ) 20 MG tablet Take 20 mg by mouth once daily    . omega-3 fatty acids/fish oil 340-1,000 mg capsule 1 capsule once daily    . omeprazole  (PRILOSEC) 40 MG DR capsule Take 1 capsule (40 mg total) by mouth once daily as needed 30 capsule 1  . propranoloL  (INDERAL ) 10 MG tablet Take 1 tablet (10 mg total) by mouth 2 (two) times daily as needed  30 tablet 5   No facility-administered medications prior to visit.    Social History   Socioeconomic History  . Marital status: Married  Tobacco Use  . Smoking status: Never  . Smokeless tobacco: Never  Vaping Use  . Vaping status: Never Used  Substance and Sexual Activity  . Alcohol use: Yes    Comment: Occasionl mixed drink or glass of wine; evy couple of months  . Drug use: No  . Sexual activity: Yes    Partners: Male    Comment: Condom or none;post menopausal; husband only partner  Other Topics Concern  . Would you please tell us  about the people who live in your home, your pets, or anything else important to  your social life? 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 Difficulty of Paying Living Expenses: Not very hard  Food Insecurity: No Food Insecurity (06/05/2023)   Hunger Vital Sign   . Worried About Programme researcher, broadcasting/film/video in the Last Year: Never true   . Ran Out of Food in the Last Year: Never true  Transportation Needs: No Transportation Needs (06/05/2023)   PRAPARE - Transportation   . Lack of Transportation (Medical): No   . Lack of Transportation (Non-Medical): No  Housing Stability: Low Risk  (06/05/2023)   Housing Stability Vital Sign   . Unable to Pay for Housing in the Last Year: No   . Number of Times Moved in the Last Year: 0   . Homeless in the Last Year: No    ROS  See HPI for additional ROS   Objective:   Vitals:   12/09/23 1029  BP: 127/68  Pulse: 54  Weight: 67.6 kg (149 lb 0.5 oz)  Height: 165.1 cm (5' 5)  PainSc: 0-No pain   Body mass index is 24.8 kg/m.  Physical Exam Constitutional: alert and in NAD Respiratory: clear to auscultation, without rales or wheezes  Cardiovascular: RRR without murmurs, rubs or gallops   Assessment/Plan:   Diagnoses and all orders for this visit:  Dizziness -     Ambulatory Referral to Physical Therapy  Balance problem -     Ambulatory Referral to Physical Therapy  Essential hypertension  Great toe pain, right  Other orders -     External Radiology -Mammo/bilat -     External Radiology -Mammo/bilat -     Follow up in Primary Care  Visit today for chronic care follow-up.  Continues to experience intermittent dizziness and balance issues, though overall improved compared to prior.  Unclear if COVID-vaccine or and/or COVID infection  contributed to these symptoms.  Agree that resuming PT could be helpful; referral placed.  Blood pressure at goal on current regimen; no changes today.  Reports ongoing right great toe pain and numbness; gave her some information on podiatry options in the area.  If needs referral, I am happy to place.  Follow-up as planned for physical, sooner if needed.   Future Appointments     Date/Time Provider Department Center Visit Type   07/06/2024 10:00 AM (Arrive by 9:45 AM) Miriam Rocky Shams, MD [facility] PHYSICAL       Rocky Miriam, MD  PhD [facility]  This note was written using AI and/or Office manager. There may be spelling and/or grammatical errors and phrases that were mistakenly interpreted by the software and missed in my review.

## 2023-12-10 NOTE — Progress Notes (Unsigned)
 Cardiology Clinic Note   Date: 12/12/2023 ID: Elysse Polidore, DOB 03-14-1961, MRN [id number]  Primary Cardiologist:  Evalene Lunger, MD  Chief Complaint   Beckett Hickmon is a 63 y.o. female who presents to the clinic today for routine follow up.   Patient Profile   Cathyrn Deas is followed by Dr. Gollan for the history outlined below.      Past medical history significant for: Hypertension.  Pericarditis in 2014.  Echo 05/08/2020: EF 60-65%. No RWMA. Grade II DD. Normal RV function. Mild MR. No evidence of pericardial effusion.  Hyperlipidemia. Lipid panel 06/04/2022: LDL 96, HDL 52, TG 115, total 171.   Palpitations. 2-week Zio 03/31/2023: HR 46-118 bpm, average 61 bpm. Rare ectopy. Patient triggered events associated with NSR and rare PVCs.  GERD.  Benign positional vertigo.    In summary, patient has a history of pericarditis in 2014 treated with colchicine  and NSAIDs. Previous cardiac workup at [facility] in September 2014 included a stress echo which showed normal stress and trivial pericardial effusion. Stress test December 2018 showed no significant ischemia. Seen for atypical chest pain in January 2022. Troponin negative. Echo showed normal LV/RV function, grade II DD, mild MR. She was seen at [facility] ED in November 2022 for vertigo. MRI was normal. At an August 2023 office visit she reported vertigo workup at [facility] included an US  studied which showed disturbed flow in left subclavian, normal carotids, and normal vertebral arteries. She was evaluated by Dr. Darron for left subclavian artery stenosis. It was felt she was not showing any evidence of stenosis/subclavian steal with equal pulses and BP bilateral upper extremities. No further workup was recommended.    Patient was seen in the office on 01/20/2023 for concerns with elevated BP. She reported a 2 week history of elevated BP with readings as high as 157/87. She reported associated headache and dizziness. She received  Covid vaccine on 10/8. Vertigo symptoms started 2 years prior after Covid and had increased symptoms after vaccine. She also reported increased palpitations possibly related to vaccine. Patient was changed from Losartan  to olmesartan .   Patient was last seen in the office by me on 04/25/2023 for follow-up after testing.  She reported improved dizziness/vertigo since starting feverfew per ENT.  She continued to have episodes of palpitations that are better than previous and had not needed propranolol  for quite some time.  BP better controlled on olmesartan .  No medication changes were made.     History of Present Illness    Today, patient is doing well from a cardiac standpoint. Patient denies shortness of breath, dyspnea on exertion, lower extremity edema, orthopnea or PND. No chest pain, pressure, or tightness. No palpitations.  She has not needed propranolol  in several months. Home BP has been well controlled typically running <120/70s. She retired from her job in April of this year which has helped her tremendously in caring for her mother whom she lives with. She does have a home health aide come in to assist with her mom's care 3 days a week. Unfortunately, she continues to have dizziness. She started feverfew which helped some of her symptoms but still has not stopped the episodes of dizziness.     ROS: All other systems reviewed and are otherwise negative except as noted in History of Present Illness.  EKGs/Labs Reviewed    EKG Interpretation Date/Time:  Monday December 12 2023 13:46:42 EDT Ventricular Rate:  51 PR Interval:  132 QRS Duration:  76 QT Interval:  456 QTC Calculation: 420 R Axis:   -27  Text Interpretation: Sinus bradycardia Nonspecific ST abnormality When compared with ECG of 20-Jan-2023 13:54, No significant change Confirmed by Loistine Sober [phone]) on 12/12/2023 1:49:30 PM   02/21/2023: BUN 19; Creatinine, Ser 0.86; Potassium 3.7; Sodium 136   Physical Exam     VS:  BP 128/72 (BP Location: Left Arm, Patient Position: Sitting, Cuff Size: Normal)   Pulse (!) 51   Ht 5' 5 (1.651 m)   Wt 148 lb (67.1 kg)   SpO2 99%   BMI 24.63 kg/m  , BMI Body mass index is 24.63 kg/m.  GEN: Well nourished, well developed, in no acute distress. Neck: No JVD or carotid bruits. Cardiac:  RRR.  No murmur. No rubs or gallops.   Respiratory:  Respirations regular and unlabored. Clear to auscultation without rales, wheezing or rhonchi. GI: Soft, nontender, nondistended. Extremities: Radials/DP/PT 2+ and equal bilaterally. No clubbing or cyanosis. No edema.  Skin: Warm and dry, no rash. Neuro: Strength intact.  Assessment & Plan   Hypertension BP today 128/72. She has chronic dizziness. Home BP typically <120/70. Discussed holding hydrochlorothiazide  if she feels her BP is getting to low.  -Continue hydrochlorothiazide , olmesartan .   Dizziness/Vertigo Patient reports continued episodes of dizziness. She is still taking feverfew but it is not helping as much as she hoped.  - Continue feverfew. - Continue to follow with ENT.    Palpitations 14 day Zio December 2024 showed rare ectopy. Patient denies palpitations. She has not needed propranolol  for several months.  -Continue as needed propranolol .   Disposition: Return in 1 year or sooner as needed.          Signed, Sober HERO. Spiros Greenfeld, DNP, NP-C

## 2023-12-12 ENCOUNTER — Ambulatory Visit: Attending: Student | Admitting: Student

## 2023-12-12 ENCOUNTER — Encounter: Payer: Self-pay | Admitting: Student

## 2023-12-12 VITALS — BP 128/72 | HR 51 | Ht 65.0 in | Wt 148.0 lb

## 2023-12-12 DIAGNOSIS — H8110 Benign paroxysmal vertigo, unspecified ear: Secondary | ICD-10-CM

## 2023-12-12 DIAGNOSIS — R002 Palpitations: Secondary | ICD-10-CM | POA: Diagnosis not present

## 2023-12-12 DIAGNOSIS — R42 Dizziness and giddiness: Secondary | ICD-10-CM | POA: Diagnosis not present

## 2023-12-12 DIAGNOSIS — I10 Essential (primary) hypertension: Secondary | ICD-10-CM

## 2023-12-12 NOTE — Patient Instructions (Signed)
 Medication Instructions:  Your physician recommends that you continue on your current medications as directed. Please refer to the Current Medication list given to you today.   *If you need a refill on your cardiac medications before your next appointment, please call your pharmacy*  Lab Work: None ordered at this time  If you have labs (blood work) drawn today and your tests are completely normal, you will receive your results only by: MyChart Message (if you have MyChart) OR A paper copy in the mail If you have any lab test that is abnormal or we need to change your treatment, we will call you to review the results.  Testing/Procedures: None ordered at this time   Follow-Up: At [facility], you and your health needs are our priority.  As part of our continuing mission to provide you with exceptional heart care, our providers are all part of one team.  This team includes your primary Cardiologist (physician) and Advanced Practice Providers or APPs (Physician Assistants and Nurse Practitioners) who all work together to provide you with the care you need, when you need it.  Your next appointment:   1 year(s)  Provider:   Evalene Lunger, MD or Barnie Hila, NP    We recommend signing up for the patient portal called MyChart.  Sign up information is provided on this After Visit Summary.  MyChart is used to connect with patients for Virtual Visits (Telemedicine).  Patients are able to view lab/test results, encounter notes, upcoming appointments, etc.  Non-urgent messages can be sent to your provider as well.   To learn more about what you can do with MyChart, go to [email or web address].

## 2024-02-03 ENCOUNTER — Other Ambulatory Visit: Payer: Self-pay | Admitting: Cardiovascular Disease
